# Patient Record
Sex: Male | Born: 1940
Health system: Southern US, Community
[De-identification: ages and names within clinical notes are randomized; demographics above are authoritative.]

## PROBLEM LIST (undated history)

## (undated) DIAGNOSIS — R5383 Other fatigue: Secondary | ICD-10-CM

## (undated) DIAGNOSIS — N200 Calculus of kidney: Secondary | ICD-10-CM

## (undated) DIAGNOSIS — I10 Essential (primary) hypertension: Secondary | ICD-10-CM

## (undated) DIAGNOSIS — E785 Hyperlipidemia, unspecified: Secondary | ICD-10-CM

## (undated) DIAGNOSIS — E669 Obesity, unspecified: Secondary | ICD-10-CM

## (undated) DIAGNOSIS — R079 Chest pain, unspecified: Secondary | ICD-10-CM

## (undated) DIAGNOSIS — C439 Malignant melanoma of skin, unspecified: Secondary | ICD-10-CM

## (undated) DIAGNOSIS — R5381 Other malaise: Secondary | ICD-10-CM

## (undated) HISTORY — PX: EYE SURGERY: SHX253

## (undated) HISTORY — PX: SKIN SURGERY: SHX2413

## (undated) HISTORY — PX: HERNIA REPAIR: SHX51

## (undated) HISTORY — DX: Other malaise: R53.81

## (undated) HISTORY — DX: Calculus of kidney: N20.0

## (undated) HISTORY — DX: Obesity, unspecified: E66.9

## (undated) HISTORY — DX: Other fatigue: R53.83

## (undated) HISTORY — PX: BUNIONECTOMY: SHX129

## (undated) HISTORY — DX: Hyperlipidemia, unspecified: E78.5

## (undated) HISTORY — DX: Essential (primary) hypertension: I10

## (undated) HISTORY — DX: Chest pain, unspecified: R07.9

---

## 1981-11-27 DIAGNOSIS — C439 Malignant melanoma of skin, unspecified: Secondary | ICD-10-CM

## 1981-11-27 HISTORY — DX: Malignant melanoma of skin, unspecified: C43.9

## 2013-05-15 ENCOUNTER — Ambulatory Visit: Payer: Self-pay | Admitting: Family Medicine

## 2013-08-14 ENCOUNTER — Ambulatory Visit: Payer: Self-pay | Admitting: Cardiovascular Disease

## 2013-08-22 ENCOUNTER — Encounter: Payer: Self-pay | Admitting: *Deleted

## 2013-08-25 ENCOUNTER — Encounter: Payer: Self-pay | Admitting: Cardiovascular Disease

## 2013-08-25 ENCOUNTER — Ambulatory Visit (INDEPENDENT_AMBULATORY_CARE_PROVIDER_SITE_OTHER): Payer: Medicare Other | Admitting: Cardiovascular Disease

## 2013-08-25 VITALS — BP 122/82 | HR 64 | Ht 67.0 in | Wt 214.0 lb

## 2013-08-25 VITALS — BP 122/82 | HR 64 | Ht 67.0 in | Wt 214.8 lb

## 2013-08-25 DIAGNOSIS — R079 Chest pain, unspecified: Secondary | ICD-10-CM

## 2013-08-25 DIAGNOSIS — E785 Hyperlipidemia, unspecified: Secondary | ICD-10-CM | POA: Insufficient documentation

## 2013-08-25 DIAGNOSIS — I10 Essential (primary) hypertension: Secondary | ICD-10-CM | POA: Insufficient documentation

## 2013-08-25 NOTE — Procedures (Signed)
    Treadmill Stress test  Indication: Atypical chest pain with exertional dyspnea.  Baseline Data:  Resting EKG shows NSR with rate of 72 bpm, no significant ST or T wave changes. Resting blood pressure of 142/84 mm Hg Stand bruce protocal was used.  Exercise Data:  Patient exercised for 6 min 0 sec,  Peak heart rate of 144 bpm.  This was 98 % of the maximum predicted heart rate. No symptoms of chest pain or lightheadedness were reported at peak stress or in recovery.  Peak Blood pressure recorded was 190/90 Maximal work level: 7 METs.  Heart rate at 3 minutes in recovery was 101 bpm. BP response: Normal HR response: Normal  EKG with Exercise: Sinus tachycardia with 0.5 mm of upsloping ST depression in the inferior leads. Few PVCs noted in recovery.  FINAL IMPRESSION: Normal exercise stress test. No significant EKG changes concerning for ischemia. Good exercise tolerance.  Recommendation: The chest pain seems to be noncardiac. I discussed with him the importance of treating risk factors, healthy diet and regular exercise.

## 2013-08-25 NOTE — Assessment & Plan Note (Signed)
Chest pain is overall atypical and likely musculoskeletal in nature. He reports having a chest x-ray done which was suggestive of a fractured rib. He does have associated exertional dyspnea and due to that I proceeded with a treadmill stress test which showed no evidence of ischemia and no reproducible chest pain with exercise. Based on that, chest pain seems to be noncardiac. I discussed with the patient the importance of lifestyle changes in order to decrease the chance of future coronary artery disease and cardiovascular events. We discussed the importance of controlling risk factors, healthy diet as well as regular exercise. I also explained to him that a normal stress test does not rule out atherosclerosis.

## 2013-08-25 NOTE — Patient Instructions (Addendum)
Your stress test is normal.  Follow up as needed.  

## 2013-08-25 NOTE — Progress Notes (Signed)
Primary care physician: Dr. Marguerite Olea  HPI  This is a pleasant 72 year old man who was referred by Dr. Marguerite Olea for evaluation of atypical chest pain, exertional dyspnea and hypertension. He has no previous cardiac history. He has chronic medical conditions that include hypertension, hyperlipidemia, obesity and family history of coronary artery disease (although, no premature CAD). Both parents died in their 70s from myocardial infarction. He reports prolonged history of hypertension which has been controlled up until he gained 20 pounds over the last year. He also has known history of hyperlipidemia with intolerance to statins and other and hyperlipidemia medications. He reports prolonged history of mild a current chest discomfort over the last 6-12 months. This is usually localized to the right side and left side of the chest. It happens at rest and not with physical activities. It's response to Aleve. He did notice increased exertional dyspnea. He denies palpitations, dizziness or syncope.   No Known Allergies   Current Outpatient Prescriptions on File Prior to Visit  Medication Sig Dispense Refill  . Ascorbic Acid (VITAMIN C) 1000 MG tablet Take 1,000 mg by mouth daily.      Marland Kitchen aspirin 81 MG tablet Take 81 mg by mouth daily.      Marland Kitchen BIOTIN PO Take 500 mg by mouth daily.      . Calcium 500 MG tablet Take 500 mg by mouth daily.      . Calcium Carbonate-Vitamin D (CALCIUM-VITAMIN D) 500-200 MG-UNIT per tablet Take 1 tablet by mouth 2 (two) times daily with a meal.      . Coenzyme Q10-Vitamin E 100-300 MG-UNIT WAFR Take by mouth daily.      Marland Kitchen losartan-hydrochlorothiazide (HYZAAR) 100-12.5 MG per tablet Take 1 tablet by mouth daily.      . Multiple Vitamin (MULTIVITAMIN) tablet Take 1 tablet by mouth daily.       No current facility-administered medications on file prior to visit.     Past Medical History  Diagnosis Date  . Chest pain, unspecified   . Other malaise and fatigue   . Kidney  stone   . Other and unspecified hyperlipidemia   . Hypertension   . Obesity      Past Surgical History  Procedure Laterality Date  . Hernia repair    . Bunionectomy    . Skin surgery      melanoma     Family History  Problem Relation Age of Onset  . Heart attack Mother   . Hypertension Mother   . Heart attack Father   . Hypertension Father      History   Social History  . Marital Status: Married    Spouse Name: N/A    Number of Children: N/A  . Years of Education: N/A   Occupational History  . Not on file.   Social History Main Topics  . Smoking status: Former Smoker -- 1.00 packs/day for 30 years    Types: Cigarettes  . Smokeless tobacco: Not on file  . Alcohol Use: Yes     Comment: occassional  . Drug Use: No  . Sexual Activity: Not on file   Other Topics Concern  . Not on file   Social History Narrative  . No narrative on file     ROS A 10 point review of system was performed. It is negative other than what's mentioned in the history of present illness.  PHYSICAL EXAM   BP 122/82  Pulse 64  Ht 5\' 7"  (1.702 m)  Wt  214 lb 12 oz (97.41 kg)  BMI 33.63 kg/m2 Constitutional: He is oriented to person, place, and time. He appears well-developed and well-nourished. No distress.  HENT: No nasal discharge.  Head: Normocephalic and atraumatic.  Eyes: Pupils are equal and round. Right eye exhibits no discharge. Left eye exhibits no discharge.  Neck: Normal range of motion. Neck supple. No JVD present. No thyromegaly present.  Cardiovascular: Normal rate, regular rhythm, normal heart sounds and. Exam reveals no gallop and no friction rub. No murmur heard.  Pulmonary/Chest: Effort normal and breath sounds normal. No stridor. No respiratory distress. He has no wheezes. He has no rales. He exhibits no tenderness.  Abdominal: Soft. Bowel sounds are normal. He exhibits no distension. There is no tenderness. There is no rebound and no guarding.  Musculoskeletal:  Normal range of motion. He exhibits no edema and no tenderness.  Neurological: He is alert and oriented to person, place, and time. Coordination normal.  Skin: Skin is warm and dry. No rash noted. He is not diaphoretic. No erythema. No pallor.  Psychiatric: He has a normal mood and affect. His behavior is normal. Judgment and thought content normal.       WJX:BJYNW  Rhythm  WITHIN NORMAL LIMITS   ASSESSMENT AND PLAN

## 2013-08-25 NOTE — Assessment & Plan Note (Signed)
Blood pressure is now well controlled after the recent addition of amlodipine. This should improve further with exercise and weight loss.

## 2013-08-25 NOTE — Patient Instructions (Addendum)
Your physician has requested that you have an exercise tolerance test. For further information please visit [email or web address] Please also follow instruction sheet, as given.  Try Red yeast Rice tablets for high cholesterol.   Follow up as needed.

## 2013-08-25 NOTE — Assessment & Plan Note (Signed)
Most recent lipid profile showed a total cholesterol of 295, triglyceride of 218, HDL 52 and an LDL of 199. He reports intolerance to almost all statins as well as Zetia and WelChol.  I asked him to consider using Red yeast rice.

## 2014-04-06 ENCOUNTER — Ambulatory Visit: Payer: Self-pay | Admitting: Gastroenterology

## 2015-12-02 DIAGNOSIS — Z79899 Other long term (current) drug therapy: Secondary | ICD-10-CM | POA: Diagnosis not present

## 2015-12-02 DIAGNOSIS — E7800 Pure hypercholesterolemia, unspecified: Secondary | ICD-10-CM | POA: Diagnosis not present

## 2015-12-09 DIAGNOSIS — I10 Essential (primary) hypertension: Secondary | ICD-10-CM | POA: Diagnosis not present

## 2015-12-09 DIAGNOSIS — E7800 Pure hypercholesterolemia, unspecified: Secondary | ICD-10-CM | POA: Diagnosis not present

## 2015-12-09 DIAGNOSIS — Z79899 Other long term (current) drug therapy: Secondary | ICD-10-CM | POA: Diagnosis not present

## 2015-12-09 DIAGNOSIS — I712 Thoracic aortic aneurysm, without rupture: Secondary | ICD-10-CM | POA: Diagnosis not present

## 2015-12-09 DIAGNOSIS — Z125 Encounter for screening for malignant neoplasm of prostate: Secondary | ICD-10-CM | POA: Diagnosis not present

## 2016-02-04 DIAGNOSIS — L723 Sebaceous cyst: Secondary | ICD-10-CM | POA: Diagnosis not present

## 2016-02-04 DIAGNOSIS — E7800 Pure hypercholesterolemia, unspecified: Secondary | ICD-10-CM | POA: Diagnosis not present

## 2016-05-25 DIAGNOSIS — E7800 Pure hypercholesterolemia, unspecified: Secondary | ICD-10-CM | POA: Diagnosis not present

## 2016-05-25 DIAGNOSIS — Z125 Encounter for screening for malignant neoplasm of prostate: Secondary | ICD-10-CM | POA: Diagnosis not present

## 2016-05-25 DIAGNOSIS — Z79899 Other long term (current) drug therapy: Secondary | ICD-10-CM | POA: Diagnosis not present

## 2016-06-01 DIAGNOSIS — E7800 Pure hypercholesterolemia, unspecified: Secondary | ICD-10-CM | POA: Diagnosis not present

## 2016-06-01 DIAGNOSIS — I10 Essential (primary) hypertension: Secondary | ICD-10-CM | POA: Diagnosis not present

## 2016-06-01 DIAGNOSIS — Z79899 Other long term (current) drug therapy: Secondary | ICD-10-CM | POA: Diagnosis not present

## 2016-06-01 DIAGNOSIS — Z0000 Encounter for general adult medical examination without abnormal findings: Secondary | ICD-10-CM | POA: Diagnosis not present

## 2016-06-08 DIAGNOSIS — H2512 Age-related nuclear cataract, left eye: Secondary | ICD-10-CM | POA: Diagnosis not present

## 2016-08-04 DIAGNOSIS — H2512 Age-related nuclear cataract, left eye: Secondary | ICD-10-CM | POA: Diagnosis not present

## 2016-08-08 ENCOUNTER — Encounter: Payer: Self-pay | Admitting: *Deleted

## 2016-08-15 ENCOUNTER — Ambulatory Visit: Payer: PPO | Admitting: Anesthesiology

## 2016-08-15 ENCOUNTER — Ambulatory Visit
Admission: RE | Admit: 2016-08-15 | Discharge: 2016-08-15 | Disposition: A | Discharge status: Home / Self Care | Payer: PPO | Source: Ambulatory Visit | Attending: Ophthalmology | Admitting: Ophthalmology

## 2016-08-15 ENCOUNTER — Encounter
Admission: RE | Disposition: A | Discharge status: Home / Self Care | Payer: Self-pay | Source: Ambulatory Visit | Attending: Ophthalmology

## 2016-08-15 ENCOUNTER — Encounter: Payer: Self-pay | Admitting: *Deleted

## 2016-08-15 DIAGNOSIS — Z87891 Personal history of nicotine dependence: Secondary | ICD-10-CM | POA: Diagnosis not present

## 2016-08-15 DIAGNOSIS — H2512 Age-related nuclear cataract, left eye: Secondary | ICD-10-CM | POA: Diagnosis not present

## 2016-08-15 DIAGNOSIS — Z87442 Personal history of urinary calculi: Secondary | ICD-10-CM | POA: Diagnosis not present

## 2016-08-15 HISTORY — PX: CATARACT EXTRACTION W/PHACO: SHX586

## 2016-08-15 SURGERY — PHACOEMULSIFICATION, CATARACT, WITH IOL INSERTION
Anesthesia: Monitor Anesthesia Care | Site: Eye | Laterality: Left | Wound class: Clean

## 2016-08-15 MED ORDER — EPINEPHRINE HCL 1 MG/ML IJ SOLN
INTRAOCULAR | Status: DC | PRN
  Administered 2016-08-15: 1 mL via OPHTHALMIC

## 2016-08-15 MED ORDER — MOXIFLOXACIN HCL 0.5 % OP SOLN
OPHTHALMIC | Status: DC | PRN
  Administered 2016-08-15: 1 [drp] via OPHTHALMIC

## 2016-08-15 MED ORDER — MIDAZOLAM HCL 2 MG/2ML IJ SOLN
INTRAMUSCULAR | Status: DC | PRN
  Administered 2016-08-15: 2 mg via INTRAVENOUS

## 2016-08-15 MED ORDER — CARBACHOL 0.01 % IO SOLN
INTRAOCULAR | Status: DC | PRN
  Administered 2016-08-15: .5 mL via INTRAOCULAR

## 2016-08-15 MED ORDER — CEFUROXIME OPHTHALMIC INJECTION 1 MG/0.1 ML
INJECTION | OPHTHALMIC | Status: AC
  Filled 2016-08-15: qty 0.1

## 2016-08-15 MED ORDER — ARMC OPHTHALMIC DILATING GEL
OPHTHALMIC | Status: AC
  Filled 2016-08-15: qty 0.25

## 2016-08-15 MED ORDER — CEFUROXIME OPHTHALMIC INJECTION 1 MG/0.1 ML
INJECTION | OPHTHALMIC | Status: DC | PRN
  Administered 2016-08-15: .1 mL via INTRACAMERAL

## 2016-08-15 MED ORDER — TETRACAINE HCL 0.5 % OP SOLN
OPHTHALMIC | Status: AC
  Filled 2016-08-15: qty 2

## 2016-08-15 MED ORDER — NA CHONDROIT SULF-NA HYALURON 40-17 MG/ML IO SOLN
INTRAOCULAR | Status: DC | PRN
  Administered 2016-08-15: 1 mL via INTRAOCULAR

## 2016-08-15 MED ORDER — FENTANYL CITRATE (PF) 100 MCG/2ML IJ SOLN
INTRAMUSCULAR | Status: DC | PRN
  Administered 2016-08-15: 50 ug via INTRAVENOUS

## 2016-08-15 MED ORDER — POVIDONE-IODINE 5 % OP SOLN
OPHTHALMIC | Status: AC
  Filled 2016-08-15: qty 30

## 2016-08-15 MED ORDER — EPINEPHRINE HCL 1 MG/ML IJ SOLN
INTRAMUSCULAR | Status: AC
  Filled 2016-08-15: qty 1

## 2016-08-15 MED ORDER — MOXIFLOXACIN HCL 0.5 % OP SOLN
OPHTHALMIC | Status: AC
  Filled 2016-08-15: qty 3

## 2016-08-15 MED ORDER — NA CHONDROIT SULF-NA HYALURON 40-17 MG/ML IO SOLN
INTRAOCULAR | Status: AC
  Filled 2016-08-15: qty 1

## 2016-08-15 MED ORDER — MOXIFLOXACIN HCL 0.5 % OP SOLN: 1.0000 [drp] | OPHTHALMIC | Status: DC | PRN

## 2016-08-15 MED ORDER — SODIUM CHLORIDE 0.9 % IV SOLN
INTRAVENOUS | Status: DC
  Administered 2016-08-15 (×2): via INTRAVENOUS

## 2016-08-15 MED ORDER — POVIDONE-IODINE 5 % OP SOLN
1.0000 "application " | Freq: Once | OPHTHALMIC | Status: AC
  Administered 2016-08-15: 1 via OPHTHALMIC

## 2016-08-15 MED ORDER — ARMC OPHTHALMIC DILATING GEL
1.0000 "application " | OPHTHALMIC | Status: AC | PRN
  Administered 2016-08-15 (×2): 1 via OPHTHALMIC

## 2016-08-15 MED ORDER — TETRACAINE HCL 0.5 % OP SOLN
1.0000 [drp] | Freq: Once | OPHTHALMIC | Status: AC
  Administered 2016-08-15: 1 [drp] via OPHTHALMIC

## 2016-08-15 SURGICAL SUPPLY — 21 items
CANNULA ANT/CHMB 27GA (MISCELLANEOUS) ×3 IMPLANT
CUP MEDICINE 2OZ PLAST GRAD ST (MISCELLANEOUS) ×3 IMPLANT
GLOVE BIO SURGEON STRL SZ8 (GLOVE) ×3 IMPLANT
GLOVE BIOGEL M 6.5 STRL (GLOVE) ×3 IMPLANT
GLOVE SURG LX 8.0 MICRO (GLOVE) ×2
GLOVE SURG LX STRL 8.0 MICRO (GLOVE) ×1 IMPLANT
GOWN STRL REUS W/ TWL LRG LVL3 (GOWN DISPOSABLE) ×2 IMPLANT
GOWN STRL REUS W/TWL LRG LVL3 (GOWN DISPOSABLE) ×4
LENS IOL TECNIS ITEC 19.0 (Intraocular Lens) ×3 IMPLANT
PACK CATARACT (MISCELLANEOUS) ×3 IMPLANT
PACK CATARACT BRASINGTON LX (MISCELLANEOUS) ×3 IMPLANT
PACK EYE AFTER SURG (MISCELLANEOUS) ×3 IMPLANT
SOL BSS BAG (MISCELLANEOUS) ×3
SOL PREP PVP 2OZ (MISCELLANEOUS) ×3
SOLUTION BSS BAG (MISCELLANEOUS) ×1 IMPLANT
SOLUTION PREP PVP 2OZ (MISCELLANEOUS) ×1 IMPLANT
SYR 3ML LL SCALE MARK (SYRINGE) ×3 IMPLANT
SYR 5ML LL (SYRINGE) ×3 IMPLANT
SYR TB 1ML 27GX1/2 LL (SYRINGE) ×3 IMPLANT
WATER STERILE IRR 250ML POUR (IV SOLUTION) ×3 IMPLANT
WIPE NON LINTING 3.25X3.25 (MISCELLANEOUS) ×3 IMPLANT

## 2016-08-15 NOTE — H&P (Signed)
All labs reviewed. Abnormal studies sent to patients PCP when indicated.  Previous H&P reviewed, patient examined, there are NO CHANGES.  Jay [name] LOUIS9/19/20179:50 AM

## 2016-08-15 NOTE — Op Note (Signed)
PREOPERATIVE DIAGNOSIS:  Nuclear sclerotic cataract of the left eye.   POSTOPERATIVE DIAGNOSIS:  nuclear sclerotic cataract left eye   OPERATIVE PROCEDURE:  Procedure(s): CATARACT EXTRACTION PHACO AND INTRAOCULAR LENS PLACEMENT (IOC)   SURGEON:  Birder Robson, MD.   ANESTHESIA:   Anesthesiologist: Martha Clan, MD CRNA: Allean Found, CRNA  1.      Managed anesthesia care. 2.      Topical tetracaine drops followed by 2% Xylocaine jelly applied in the preoperative holding area.   COMPLICATIONS:  None.   TECHNIQUE:   Stop and chop   DESCRIPTION OF PROCEDURE:  The patient was examined and consented in the preoperative holding area where the aforementioned topical anesthesia was applied to the left eye and then brought back to the Operating Room where the left eye was prepped and draped in the usual sterile ophthalmic fashion and a lid speculum was placed. A paracentesis was created with the side port blade and the anterior chamber was filled with viscoelastic. A near clear corneal incision was performed with the steel keratome. A continuous curvilinear capsulorrhexis was performed with a cystotome followed by the capsulorrhexis forceps. Hydrodissection and hydrodelineation were carried out with BSS on a blunt cannula. The lens was removed in a stop and chop  technique and the remaining cortical material was removed with the irrigation-aspiration handpiece. The capsular bag was inflated with viscoelastic and the Technis [id number] lens was placed in the capsular bag without complication. The remaining viscoelastic was removed from the eye with the irrigation-aspiration handpiece. The wounds were hydrated. The anterior chamber was flushed with Miostat and the eye was inflated to physiologic pressure. 0.1 mL of cefuroxime concentration 10 mg/mL was placed in the anterior chamber. The wounds were found to be water tight. The eye was dressed with Vigamox. The patient was given protective glasses to wear  throughout the day and a shield with which to sleep tonight. The patient was also given drops with which to begin a drop regimen today and will follow-up with me in one day.  Implant Name Type Inv. Item Serial No. Manufacturer Lot No. LRB No. Used  LENS IOL DIOP 19.0 - [id number] Intraocular Lens LENS IOL DIOP 19.0 [id number] AMO   Left 1   Procedure(s) with comments: CATARACT EXTRACTION PHACO AND INTRAOCULAR LENS PLACEMENT (IOC) (Left) - [place] 00:47 AP% 17.8 CDE 8.46 Fluid pack lot # [id number]  Electronically signed: [place] 08/15/2016 10:18 AM

## 2016-08-15 NOTE — Anesthesia Preprocedure Evaluation (Signed)
Anesthesia Evaluation  Patient identified by MRN, date of birth, ID band Patient awake    Reviewed: Allergy & Precautions, H&P , NPO status , Patient's Chart, lab work & pertinent test results, reviewed documented beta blocker date and time   History of Anesthesia Complications Negative for: history of anesthetic complications  Airway Mallampati: III  TM Distance: >3 FB Neck ROM: full    Dental no notable dental hx. (+) Caps   Pulmonary neg pulmonary ROS, former smoker,    Pulmonary exam normal breath sounds clear to auscultation       Cardiovascular Exercise Tolerance: Good hypertension, (-) angina(-) CAD, (-) Past MI, (-) Cardiac Stents and (-) CABG Normal cardiovascular exam(-) dysrhythmias (-) Valvular Problems/Murmurs Rhythm:regular Rate:Normal     Neuro/Psych negative neurological ROS  negative psych ROS   GI/Hepatic negative GI ROS, Neg liver ROS,   Endo/Other  negative endocrine ROS  Renal/GU Renal disease (kidney stones)  negative genitourinary   Musculoskeletal   Abdominal   Peds  Hematology negative hematology ROS (+)   Anesthesia Other Findings Past Medical History: No date: Chest pain, unspecified No date: Hypertension No date: Kidney stone No date: Obesity No date: Other and unspecified hyperlipidemia No date: Other malaise and fatigue   Reproductive/Obstetrics negative OB ROS                             Anesthesia Physical Anesthesia Plan  ASA: II  Anesthesia Plan: MAC   Post-op Pain Management:    Induction:   Airway Management Planned:   Additional Equipment:   Intra-op Plan:   Post-operative Plan:   Informed Consent: I have reviewed the patients History and Physical, chart, labs and discussed the procedure including the risks, benefits and alternatives for the proposed anesthesia with the patient or authorized representative who has indicated his/her  understanding and acceptance.   Dental Advisory Given  Plan Discussed with: Anesthesiologist, CRNA and Surgeon  Anesthesia Plan Comments:         Anesthesia Quick Evaluation

## 2016-08-15 NOTE — Anesthesia Procedure Notes (Signed)
Procedure Name: MAC Date/Time: 08/15/2016 10:05 AM Performed by: Allean Found Pre-anesthesia Checklist: Patient identified, Emergency Drugs available, Suction available, Patient being monitored and Timeout performed Patient Re-evaluated:Patient Re-evaluated prior to inductionOxygen Delivery Method: Nasal cannula Intubation Type: IV induction Dental Injury: Teeth and Oropharynx as per pre-operative assessment

## 2016-08-15 NOTE — Discharge Instructions (Signed)
Eye Surgery Discharge Instructions  Expect mild scratchy sensation or mild soreness. DO NOT RUB YOUR EYE!  The day of surgery:  Minimal physical activity, but bed rest is not required  No reading, computer work, or close hand work  No bending, lifting, or straining.  May watch TV  For 24 hours:  No driving, legal decisions, or alcoholic beverages  Safety precautions  Eat anything you prefer: It is better to start with liquids, then soup then solid foods.  _____ Eye patch should be worn until postoperative exam tomorrow.  ____ Solar shield eyeglasses should be worn for comfort in the sunlight/patch while sleeping  Resume all regular medications including aspirin or Coumadin if these were discontinued prior to surgery. You may shower, bathe, shave, or wash your hair. Tylenol may be taken for mild discomfort.  Call your doctor if you experience significant pain, nausea, or vomiting, fever > 101 or other signs of infection. [phone] or [phone] Specific instructions:  Follow-up Information    PORFILIO,WILLIAM LOUIS, MD Follow up on 08/16/2016.   Specialty:  Ophthalmology Why:  10:00 Contact information: [address] [place] [address] [phone]

## 2016-08-15 NOTE — Transfer of Care (Signed)
Immediate Anesthesia Transfer of Care Note  Patient: Jay [name]  Procedure(s) Performed: Procedure(s) with comments: CATARACT EXTRACTION PHACO AND INTRAOCULAR LENS PLACEMENT (IOC) (Left) - [place] 00:47 AP% 17.8 CDE 8.46 Fluid pack lot # [id number]  Patient Location: PACU  Anesthesia Type:MAC  Level of Consciousness: awake  Airway & Oxygen Therapy: Patient Spontanous Breathing  Post-op Assessment: Report given to RN and Post -op Vital signs reviewed and stable  Post vital signs: Reviewed and stable  Last Vitals:  Vitals:   08/15/16 0839  BP: 129/76  Pulse: 62  Resp: 16  Temp: 36.6 C    Last Pain:  Vitals:   08/15/16 0839  TempSrc: Tympanic         Complications: No apparent anesthesia complications

## 2016-08-15 NOTE — Anesthesia Postprocedure Evaluation (Signed)
Anesthesia Post Note  Patient: Jay [name]  Procedure(s) Performed: Procedure(s) (LRB): CATARACT EXTRACTION PHACO AND INTRAOCULAR LENS PLACEMENT (IOC) (Left)  Patient location during evaluation: PACU Anesthesia Type: MAC Level of consciousness: awake Pain management: pain level controlled Vital Signs Assessment: post-procedure vital signs reviewed and stable Respiratory status: spontaneous breathing Cardiovascular status: blood pressure returned to baseline Postop Assessment: no headache Anesthetic complications: no    Last Vitals:  Vitals:   08/15/16 0839  BP: 129/76  Pulse: 62  Resp: 16  Temp: 36.6 C    Last Pain:  Vitals:   08/15/16 0839  TempSrc: Tympanic                 [name]

## 2016-11-28 DIAGNOSIS — Z79899 Other long term (current) drug therapy: Secondary | ICD-10-CM | POA: Diagnosis not present

## 2016-11-28 DIAGNOSIS — E7800 Pure hypercholesterolemia, unspecified: Secondary | ICD-10-CM | POA: Diagnosis not present

## 2016-12-04 ENCOUNTER — Other Ambulatory Visit: Payer: Self-pay | Admitting: Family Medicine

## 2016-12-04 DIAGNOSIS — Z79899 Other long term (current) drug therapy: Secondary | ICD-10-CM | POA: Diagnosis not present

## 2016-12-04 DIAGNOSIS — I712 Thoracic aortic aneurysm, without rupture, unspecified: Secondary | ICD-10-CM

## 2016-12-04 DIAGNOSIS — I10 Essential (primary) hypertension: Secondary | ICD-10-CM | POA: Diagnosis not present

## 2016-12-04 DIAGNOSIS — Z125 Encounter for screening for malignant neoplasm of prostate: Secondary | ICD-10-CM | POA: Diagnosis not present

## 2016-12-04 DIAGNOSIS — R739 Hyperglycemia, unspecified: Secondary | ICD-10-CM | POA: Diagnosis not present

## 2016-12-04 DIAGNOSIS — E7800 Pure hypercholesterolemia, unspecified: Secondary | ICD-10-CM | POA: Diagnosis not present

## 2016-12-18 ENCOUNTER — Ambulatory Visit
Admission: RE | Admit: 2016-12-18 | Discharge: 2016-12-18 | Disposition: A | Discharge status: Home / Self Care | Payer: PPO | Source: Ambulatory Visit | Attending: Family Medicine | Admitting: Family Medicine

## 2016-12-18 DIAGNOSIS — I712 Thoracic aortic aneurysm, without rupture, unspecified: Secondary | ICD-10-CM

## 2016-12-18 DIAGNOSIS — R933 Abnormal findings on diagnostic imaging of other parts of digestive tract: Secondary | ICD-10-CM | POA: Diagnosis not present

## 2016-12-18 DIAGNOSIS — I2510 Atherosclerotic heart disease of native coronary artery without angina pectoris: Secondary | ICD-10-CM | POA: Insufficient documentation

## 2016-12-18 DIAGNOSIS — I700 Atherosclerosis of aorta: Secondary | ICD-10-CM | POA: Diagnosis not present

## 2016-12-18 HISTORY — DX: Malignant melanoma of skin, unspecified: C43.9

## 2016-12-18 MED ORDER — IOPAMIDOL (ISOVUE-370) INJECTION 76%
100.0000 mL | Freq: Once | INTRAVENOUS | Status: AC | PRN
  Administered 2016-12-18: 100 mL via INTRAVENOUS

## 2016-12-19 ENCOUNTER — Other Ambulatory Visit: Payer: Self-pay | Admitting: Family Medicine

## 2016-12-19 DIAGNOSIS — K8689 Other specified diseases of pancreas: Secondary | ICD-10-CM

## 2017-01-01 ENCOUNTER — Ambulatory Visit
Admission: RE | Admit: 2017-01-01 | Discharge: 2017-01-01 | Disposition: A | Discharge status: Home / Self Care | Payer: PPO | Source: Ambulatory Visit | Attending: Family Medicine | Admitting: Family Medicine

## 2017-01-01 DIAGNOSIS — K862 Cyst of pancreas: Secondary | ICD-10-CM | POA: Diagnosis not present

## 2017-01-01 DIAGNOSIS — K8689 Other specified diseases of pancreas: Secondary | ICD-10-CM

## 2017-01-01 MED ORDER — GADOBENATE DIMEGLUMINE 529 MG/ML IV SOLN
18.0000 mL | Freq: Once | INTRAVENOUS | Status: AC | PRN
  Administered 2017-01-01: 18 mL via INTRAVENOUS

## 2017-05-17 DIAGNOSIS — Z961 Presence of intraocular lens: Secondary | ICD-10-CM | POA: Diagnosis not present

## 2017-06-07 DIAGNOSIS — E7800 Pure hypercholesterolemia, unspecified: Secondary | ICD-10-CM | POA: Diagnosis not present

## 2017-06-07 DIAGNOSIS — Z79899 Other long term (current) drug therapy: Secondary | ICD-10-CM | POA: Diagnosis not present

## 2017-06-07 DIAGNOSIS — Z125 Encounter for screening for malignant neoplasm of prostate: Secondary | ICD-10-CM | POA: Diagnosis not present

## 2017-06-07 DIAGNOSIS — R739 Hyperglycemia, unspecified: Secondary | ICD-10-CM | POA: Diagnosis not present

## 2017-06-12 ENCOUNTER — Other Ambulatory Visit: Payer: Self-pay | Admitting: Family Medicine

## 2017-06-12 DIAGNOSIS — Z0000 Encounter for general adult medical examination without abnormal findings: Secondary | ICD-10-CM | POA: Diagnosis not present

## 2017-06-12 DIAGNOSIS — R739 Hyperglycemia, unspecified: Secondary | ICD-10-CM | POA: Diagnosis not present

## 2017-06-12 DIAGNOSIS — Z23 Encounter for immunization: Secondary | ICD-10-CM | POA: Diagnosis not present

## 2017-06-12 DIAGNOSIS — K8689 Other specified diseases of pancreas: Secondary | ICD-10-CM

## 2017-07-03 ENCOUNTER — Other Ambulatory Visit: Payer: PPO

## 2017-09-27 DIAGNOSIS — D485 Neoplasm of uncertain behavior of skin: Secondary | ICD-10-CM | POA: Diagnosis not present

## 2017-09-27 DIAGNOSIS — Z85820 Personal history of malignant melanoma of skin: Secondary | ICD-10-CM | POA: Diagnosis not present

## 2017-09-27 DIAGNOSIS — L578 Other skin changes due to chronic exposure to nonionizing radiation: Secondary | ICD-10-CM | POA: Diagnosis not present

## 2017-09-27 DIAGNOSIS — L739 Follicular disorder, unspecified: Secondary | ICD-10-CM | POA: Diagnosis not present

## 2018-02-14 DIAGNOSIS — F418 Other specified anxiety disorders: Secondary | ICD-10-CM | POA: Diagnosis not present

## 2018-02-14 DIAGNOSIS — Z79899 Other long term (current) drug therapy: Secondary | ICD-10-CM | POA: Diagnosis not present

## 2018-02-14 DIAGNOSIS — R739 Hyperglycemia, unspecified: Secondary | ICD-10-CM | POA: Diagnosis not present

## 2018-02-14 DIAGNOSIS — E7800 Pure hypercholesterolemia, unspecified: Secondary | ICD-10-CM | POA: Diagnosis not present

## 2018-03-05 DIAGNOSIS — J209 Acute bronchitis, unspecified: Secondary | ICD-10-CM | POA: Diagnosis not present

## 2018-08-14 DIAGNOSIS — L219 Seborrheic dermatitis, unspecified: Secondary | ICD-10-CM | POA: Diagnosis not present

## 2018-08-14 DIAGNOSIS — L739 Follicular disorder, unspecified: Secondary | ICD-10-CM | POA: Diagnosis not present

## 2018-08-14 DIAGNOSIS — I788 Other diseases of capillaries: Secondary | ICD-10-CM | POA: Diagnosis not present

## 2018-08-14 DIAGNOSIS — Z85820 Personal history of malignant melanoma of skin: Secondary | ICD-10-CM | POA: Diagnosis not present

## 2018-08-14 DIAGNOSIS — L821 Other seborrheic keratosis: Secondary | ICD-10-CM | POA: Diagnosis not present

## 2018-08-14 DIAGNOSIS — L578 Other skin changes due to chronic exposure to nonionizing radiation: Secondary | ICD-10-CM | POA: Diagnosis not present

## 2018-08-14 DIAGNOSIS — L719 Rosacea, unspecified: Secondary | ICD-10-CM | POA: Diagnosis not present

## 2018-09-27 DIAGNOSIS — Z125 Encounter for screening for malignant neoplasm of prostate: Secondary | ICD-10-CM | POA: Diagnosis not present

## 2018-09-27 DIAGNOSIS — R739 Hyperglycemia, unspecified: Secondary | ICD-10-CM | POA: Diagnosis not present

## 2018-09-27 DIAGNOSIS — Z79899 Other long term (current) drug therapy: Secondary | ICD-10-CM | POA: Diagnosis not present

## 2018-09-27 DIAGNOSIS — E7800 Pure hypercholesterolemia, unspecified: Secondary | ICD-10-CM | POA: Diagnosis not present

## 2018-10-04 DIAGNOSIS — Z0000 Encounter for general adult medical examination without abnormal findings: Secondary | ICD-10-CM | POA: Diagnosis not present

## 2019-04-03 DIAGNOSIS — E7800 Pure hypercholesterolemia, unspecified: Secondary | ICD-10-CM | POA: Diagnosis not present

## 2019-04-03 DIAGNOSIS — Z79899 Other long term (current) drug therapy: Secondary | ICD-10-CM | POA: Diagnosis not present

## 2019-04-03 DIAGNOSIS — R739 Hyperglycemia, unspecified: Secondary | ICD-10-CM | POA: Diagnosis not present

## 2019-04-10 DIAGNOSIS — N401 Enlarged prostate with lower urinary tract symptoms: Secondary | ICD-10-CM | POA: Diagnosis not present

## 2019-04-10 DIAGNOSIS — I10 Essential (primary) hypertension: Secondary | ICD-10-CM | POA: Diagnosis not present

## 2019-04-10 DIAGNOSIS — E7800 Pure hypercholesterolemia, unspecified: Secondary | ICD-10-CM | POA: Diagnosis not present

## 2019-04-10 DIAGNOSIS — Z79899 Other long term (current) drug therapy: Secondary | ICD-10-CM | POA: Diagnosis not present

## 2019-04-10 DIAGNOSIS — Z125 Encounter for screening for malignant neoplasm of prostate: Secondary | ICD-10-CM | POA: Diagnosis not present

## 2019-04-10 DIAGNOSIS — R739 Hyperglycemia, unspecified: Secondary | ICD-10-CM | POA: Diagnosis not present

## 2019-04-10 DIAGNOSIS — N183 Chronic kidney disease, stage 3 (moderate): Secondary | ICD-10-CM | POA: Diagnosis not present

## 2019-04-10 DIAGNOSIS — R351 Nocturia: Secondary | ICD-10-CM | POA: Diagnosis not present

## 2019-06-19 DIAGNOSIS — D2271 Melanocytic nevi of right lower limb, including hip: Secondary | ICD-10-CM | POA: Diagnosis not present

## 2019-06-19 DIAGNOSIS — L739 Follicular disorder, unspecified: Secondary | ICD-10-CM | POA: Diagnosis not present

## 2019-06-19 DIAGNOSIS — L821 Other seborrheic keratosis: Secondary | ICD-10-CM | POA: Diagnosis not present

## 2019-06-19 DIAGNOSIS — D2261 Melanocytic nevi of right upper limb, including shoulder: Secondary | ICD-10-CM | POA: Diagnosis not present

## 2019-06-19 DIAGNOSIS — D225 Melanocytic nevi of trunk: Secondary | ICD-10-CM | POA: Diagnosis not present

## 2019-06-19 DIAGNOSIS — L905 Scar conditions and fibrosis of skin: Secondary | ICD-10-CM | POA: Diagnosis not present

## 2019-06-19 DIAGNOSIS — D2262 Melanocytic nevi of left upper limb, including shoulder: Secondary | ICD-10-CM | POA: Diagnosis not present

## 2019-06-19 DIAGNOSIS — B001 Herpesviral vesicular dermatitis: Secondary | ICD-10-CM | POA: Diagnosis not present

## 2019-06-19 DIAGNOSIS — D485 Neoplasm of uncertain behavior of skin: Secondary | ICD-10-CM | POA: Diagnosis not present

## 2019-06-19 DIAGNOSIS — D2272 Melanocytic nevi of left lower limb, including hip: Secondary | ICD-10-CM | POA: Diagnosis not present

## 2019-07-07 DIAGNOSIS — L570 Actinic keratosis: Secondary | ICD-10-CM | POA: Diagnosis not present

## 2019-07-07 DIAGNOSIS — D485 Neoplasm of uncertain behavior of skin: Secondary | ICD-10-CM | POA: Diagnosis not present

## 2019-07-07 DIAGNOSIS — L0202 Furuncle of face: Secondary | ICD-10-CM | POA: Diagnosis not present

## 2019-09-15 DIAGNOSIS — M79645 Pain in left finger(s): Secondary | ICD-10-CM | POA: Diagnosis not present

## 2019-10-10 DIAGNOSIS — E7800 Pure hypercholesterolemia, unspecified: Secondary | ICD-10-CM | POA: Diagnosis not present

## 2019-10-10 DIAGNOSIS — R739 Hyperglycemia, unspecified: Secondary | ICD-10-CM | POA: Diagnosis not present

## 2019-10-10 DIAGNOSIS — N183 Chronic kidney disease, stage 3 unspecified: Secondary | ICD-10-CM | POA: Diagnosis not present

## 2019-10-10 DIAGNOSIS — Z79899 Other long term (current) drug therapy: Secondary | ICD-10-CM | POA: Diagnosis not present

## 2019-10-10 DIAGNOSIS — Z125 Encounter for screening for malignant neoplasm of prostate: Secondary | ICD-10-CM | POA: Diagnosis not present

## 2019-10-17 DIAGNOSIS — Z0000 Encounter for general adult medical examination without abnormal findings: Secondary | ICD-10-CM | POA: Diagnosis not present

## 2019-10-17 DIAGNOSIS — R739 Hyperglycemia, unspecified: Secondary | ICD-10-CM | POA: Diagnosis not present

## 2019-10-21 DIAGNOSIS — M79645 Pain in left finger(s): Secondary | ICD-10-CM | POA: Diagnosis not present

## 2020-01-06 DIAGNOSIS — M79645 Pain in left finger(s): Secondary | ICD-10-CM | POA: Diagnosis not present

## 2020-01-06 DIAGNOSIS — M25531 Pain in right wrist: Secondary | ICD-10-CM | POA: Diagnosis not present

## 2020-01-06 DIAGNOSIS — M25532 Pain in left wrist: Secondary | ICD-10-CM | POA: Diagnosis not present

## 2020-02-04 DIAGNOSIS — M9902 Segmental and somatic dysfunction of thoracic region: Secondary | ICD-10-CM | POA: Diagnosis not present

## 2020-02-04 DIAGNOSIS — M5033 Other cervical disc degeneration, cervicothoracic region: Secondary | ICD-10-CM | POA: Diagnosis not present

## 2020-02-04 DIAGNOSIS — M9901 Segmental and somatic dysfunction of cervical region: Secondary | ICD-10-CM | POA: Diagnosis not present

## 2020-02-04 DIAGNOSIS — M5412 Radiculopathy, cervical region: Secondary | ICD-10-CM | POA: Diagnosis not present

## 2020-02-05 DIAGNOSIS — M9902 Segmental and somatic dysfunction of thoracic region: Secondary | ICD-10-CM | POA: Diagnosis not present

## 2020-02-05 DIAGNOSIS — M5412 Radiculopathy, cervical region: Secondary | ICD-10-CM | POA: Diagnosis not present

## 2020-02-05 DIAGNOSIS — M5033 Other cervical disc degeneration, cervicothoracic region: Secondary | ICD-10-CM | POA: Diagnosis not present

## 2020-02-05 DIAGNOSIS — M9901 Segmental and somatic dysfunction of cervical region: Secondary | ICD-10-CM | POA: Diagnosis not present

## 2020-02-06 DIAGNOSIS — M9901 Segmental and somatic dysfunction of cervical region: Secondary | ICD-10-CM | POA: Diagnosis not present

## 2020-02-06 DIAGNOSIS — M5033 Other cervical disc degeneration, cervicothoracic region: Secondary | ICD-10-CM | POA: Diagnosis not present

## 2020-02-06 DIAGNOSIS — M5412 Radiculopathy, cervical region: Secondary | ICD-10-CM | POA: Diagnosis not present

## 2020-02-06 DIAGNOSIS — M9902 Segmental and somatic dysfunction of thoracic region: Secondary | ICD-10-CM | POA: Diagnosis not present

## 2020-02-09 DIAGNOSIS — M9901 Segmental and somatic dysfunction of cervical region: Secondary | ICD-10-CM | POA: Diagnosis not present

## 2020-02-09 DIAGNOSIS — M5033 Other cervical disc degeneration, cervicothoracic region: Secondary | ICD-10-CM | POA: Diagnosis not present

## 2020-02-09 DIAGNOSIS — M9902 Segmental and somatic dysfunction of thoracic region: Secondary | ICD-10-CM | POA: Diagnosis not present

## 2020-02-09 DIAGNOSIS — M5412 Radiculopathy, cervical region: Secondary | ICD-10-CM | POA: Diagnosis not present

## 2020-02-11 DIAGNOSIS — M9902 Segmental and somatic dysfunction of thoracic region: Secondary | ICD-10-CM | POA: Diagnosis not present

## 2020-02-11 DIAGNOSIS — M9901 Segmental and somatic dysfunction of cervical region: Secondary | ICD-10-CM | POA: Diagnosis not present

## 2020-02-11 DIAGNOSIS — M5412 Radiculopathy, cervical region: Secondary | ICD-10-CM | POA: Diagnosis not present

## 2020-02-11 DIAGNOSIS — M5033 Other cervical disc degeneration, cervicothoracic region: Secondary | ICD-10-CM | POA: Diagnosis not present

## 2020-02-12 DIAGNOSIS — M9901 Segmental and somatic dysfunction of cervical region: Secondary | ICD-10-CM | POA: Diagnosis not present

## 2020-02-12 DIAGNOSIS — M5033 Other cervical disc degeneration, cervicothoracic region: Secondary | ICD-10-CM | POA: Diagnosis not present

## 2020-02-12 DIAGNOSIS — M5412 Radiculopathy, cervical region: Secondary | ICD-10-CM | POA: Diagnosis not present

## 2020-02-12 DIAGNOSIS — M9902 Segmental and somatic dysfunction of thoracic region: Secondary | ICD-10-CM | POA: Diagnosis not present

## 2020-02-16 DIAGNOSIS — M5412 Radiculopathy, cervical region: Secondary | ICD-10-CM | POA: Diagnosis not present

## 2020-02-16 DIAGNOSIS — M5033 Other cervical disc degeneration, cervicothoracic region: Secondary | ICD-10-CM | POA: Diagnosis not present

## 2020-02-16 DIAGNOSIS — M9901 Segmental and somatic dysfunction of cervical region: Secondary | ICD-10-CM | POA: Diagnosis not present

## 2020-02-16 DIAGNOSIS — M9902 Segmental and somatic dysfunction of thoracic region: Secondary | ICD-10-CM | POA: Diagnosis not present

## 2020-02-18 DIAGNOSIS — M9901 Segmental and somatic dysfunction of cervical region: Secondary | ICD-10-CM | POA: Diagnosis not present

## 2020-02-18 DIAGNOSIS — M9902 Segmental and somatic dysfunction of thoracic region: Secondary | ICD-10-CM | POA: Diagnosis not present

## 2020-02-18 DIAGNOSIS — M5412 Radiculopathy, cervical region: Secondary | ICD-10-CM | POA: Diagnosis not present

## 2020-02-18 DIAGNOSIS — M5033 Other cervical disc degeneration, cervicothoracic region: Secondary | ICD-10-CM | POA: Diagnosis not present

## 2020-02-19 DIAGNOSIS — M9902 Segmental and somatic dysfunction of thoracic region: Secondary | ICD-10-CM | POA: Diagnosis not present

## 2020-02-19 DIAGNOSIS — M9901 Segmental and somatic dysfunction of cervical region: Secondary | ICD-10-CM | POA: Diagnosis not present

## 2020-02-19 DIAGNOSIS — M5412 Radiculopathy, cervical region: Secondary | ICD-10-CM | POA: Diagnosis not present

## 2020-02-19 DIAGNOSIS — M5033 Other cervical disc degeneration, cervicothoracic region: Secondary | ICD-10-CM | POA: Diagnosis not present

## 2020-02-24 DIAGNOSIS — M5033 Other cervical disc degeneration, cervicothoracic region: Secondary | ICD-10-CM | POA: Diagnosis not present

## 2020-02-24 DIAGNOSIS — M9902 Segmental and somatic dysfunction of thoracic region: Secondary | ICD-10-CM | POA: Diagnosis not present

## 2020-02-24 DIAGNOSIS — M9901 Segmental and somatic dysfunction of cervical region: Secondary | ICD-10-CM | POA: Diagnosis not present

## 2020-02-24 DIAGNOSIS — M5412 Radiculopathy, cervical region: Secondary | ICD-10-CM | POA: Diagnosis not present

## 2020-03-01 DIAGNOSIS — M5412 Radiculopathy, cervical region: Secondary | ICD-10-CM | POA: Diagnosis not present

## 2020-03-01 DIAGNOSIS — M9902 Segmental and somatic dysfunction of thoracic region: Secondary | ICD-10-CM | POA: Diagnosis not present

## 2020-03-01 DIAGNOSIS — M9901 Segmental and somatic dysfunction of cervical region: Secondary | ICD-10-CM | POA: Diagnosis not present

## 2020-03-01 DIAGNOSIS — M5033 Other cervical disc degeneration, cervicothoracic region: Secondary | ICD-10-CM | POA: Diagnosis not present

## 2020-03-03 DIAGNOSIS — M9901 Segmental and somatic dysfunction of cervical region: Secondary | ICD-10-CM | POA: Diagnosis not present

## 2020-03-03 DIAGNOSIS — M5412 Radiculopathy, cervical region: Secondary | ICD-10-CM | POA: Diagnosis not present

## 2020-03-03 DIAGNOSIS — M9902 Segmental and somatic dysfunction of thoracic region: Secondary | ICD-10-CM | POA: Diagnosis not present

## 2020-03-03 DIAGNOSIS — M5033 Other cervical disc degeneration, cervicothoracic region: Secondary | ICD-10-CM | POA: Diagnosis not present

## 2020-03-08 DIAGNOSIS — M9901 Segmental and somatic dysfunction of cervical region: Secondary | ICD-10-CM | POA: Diagnosis not present

## 2020-03-08 DIAGNOSIS — M5412 Radiculopathy, cervical region: Secondary | ICD-10-CM | POA: Diagnosis not present

## 2020-03-08 DIAGNOSIS — M9902 Segmental and somatic dysfunction of thoracic region: Secondary | ICD-10-CM | POA: Diagnosis not present

## 2020-03-08 DIAGNOSIS — M5033 Other cervical disc degeneration, cervicothoracic region: Secondary | ICD-10-CM | POA: Diagnosis not present

## 2020-03-11 DIAGNOSIS — M9902 Segmental and somatic dysfunction of thoracic region: Secondary | ICD-10-CM | POA: Diagnosis not present

## 2020-03-11 DIAGNOSIS — M5033 Other cervical disc degeneration, cervicothoracic region: Secondary | ICD-10-CM | POA: Diagnosis not present

## 2020-03-11 DIAGNOSIS — M9901 Segmental and somatic dysfunction of cervical region: Secondary | ICD-10-CM | POA: Diagnosis not present

## 2020-03-11 DIAGNOSIS — M5412 Radiculopathy, cervical region: Secondary | ICD-10-CM | POA: Diagnosis not present

## 2020-03-22 DIAGNOSIS — M9902 Segmental and somatic dysfunction of thoracic region: Secondary | ICD-10-CM | POA: Diagnosis not present

## 2020-03-22 DIAGNOSIS — M5412 Radiculopathy, cervical region: Secondary | ICD-10-CM | POA: Diagnosis not present

## 2020-03-22 DIAGNOSIS — M9901 Segmental and somatic dysfunction of cervical region: Secondary | ICD-10-CM | POA: Diagnosis not present

## 2020-03-22 DIAGNOSIS — M5033 Other cervical disc degeneration, cervicothoracic region: Secondary | ICD-10-CM | POA: Diagnosis not present

## 2020-03-25 DIAGNOSIS — M9902 Segmental and somatic dysfunction of thoracic region: Secondary | ICD-10-CM | POA: Diagnosis not present

## 2020-03-25 DIAGNOSIS — M9901 Segmental and somatic dysfunction of cervical region: Secondary | ICD-10-CM | POA: Diagnosis not present

## 2020-03-25 DIAGNOSIS — M5033 Other cervical disc degeneration, cervicothoracic region: Secondary | ICD-10-CM | POA: Diagnosis not present

## 2020-03-25 DIAGNOSIS — M5412 Radiculopathy, cervical region: Secondary | ICD-10-CM | POA: Diagnosis not present

## 2020-03-31 DIAGNOSIS — M5412 Radiculopathy, cervical region: Secondary | ICD-10-CM | POA: Diagnosis not present

## 2020-03-31 DIAGNOSIS — M5033 Other cervical disc degeneration, cervicothoracic region: Secondary | ICD-10-CM | POA: Diagnosis not present

## 2020-03-31 DIAGNOSIS — M9901 Segmental and somatic dysfunction of cervical region: Secondary | ICD-10-CM | POA: Diagnosis not present

## 2020-03-31 DIAGNOSIS — M9902 Segmental and somatic dysfunction of thoracic region: Secondary | ICD-10-CM | POA: Diagnosis not present

## 2020-04-06 DIAGNOSIS — I10 Essential (primary) hypertension: Secondary | ICD-10-CM | POA: Diagnosis not present

## 2020-04-06 DIAGNOSIS — N1831 Chronic kidney disease, stage 3a: Secondary | ICD-10-CM | POA: Diagnosis not present

## 2020-04-06 DIAGNOSIS — R739 Hyperglycemia, unspecified: Secondary | ICD-10-CM | POA: Diagnosis not present

## 2020-04-06 DIAGNOSIS — Z79899 Other long term (current) drug therapy: Secondary | ICD-10-CM | POA: Diagnosis not present

## 2020-04-07 DIAGNOSIS — M5033 Other cervical disc degeneration, cervicothoracic region: Secondary | ICD-10-CM | POA: Diagnosis not present

## 2020-04-07 DIAGNOSIS — M5412 Radiculopathy, cervical region: Secondary | ICD-10-CM | POA: Diagnosis not present

## 2020-04-07 DIAGNOSIS — M9901 Segmental and somatic dysfunction of cervical region: Secondary | ICD-10-CM | POA: Diagnosis not present

## 2020-04-07 DIAGNOSIS — M9902 Segmental and somatic dysfunction of thoracic region: Secondary | ICD-10-CM | POA: Diagnosis not present

## 2020-04-14 DIAGNOSIS — M5033 Other cervical disc degeneration, cervicothoracic region: Secondary | ICD-10-CM | POA: Diagnosis not present

## 2020-04-14 DIAGNOSIS — M9901 Segmental and somatic dysfunction of cervical region: Secondary | ICD-10-CM | POA: Diagnosis not present

## 2020-04-14 DIAGNOSIS — M9902 Segmental and somatic dysfunction of thoracic region: Secondary | ICD-10-CM | POA: Diagnosis not present

## 2020-04-14 DIAGNOSIS — M5412 Radiculopathy, cervical region: Secondary | ICD-10-CM | POA: Diagnosis not present

## 2020-04-15 DIAGNOSIS — I10 Essential (primary) hypertension: Secondary | ICD-10-CM | POA: Diagnosis not present

## 2020-04-15 DIAGNOSIS — R197 Diarrhea, unspecified: Secondary | ICD-10-CM | POA: Diagnosis not present

## 2020-04-15 DIAGNOSIS — E7800 Pure hypercholesterolemia, unspecified: Secondary | ICD-10-CM | POA: Diagnosis not present

## 2020-04-15 DIAGNOSIS — Z79899 Other long term (current) drug therapy: Secondary | ICD-10-CM | POA: Diagnosis not present

## 2020-04-15 DIAGNOSIS — N1831 Chronic kidney disease, stage 3a: Secondary | ICD-10-CM | POA: Diagnosis not present

## 2020-04-15 DIAGNOSIS — Z125 Encounter for screening for malignant neoplasm of prostate: Secondary | ICD-10-CM | POA: Diagnosis not present

## 2020-04-15 DIAGNOSIS — R739 Hyperglycemia, unspecified: Secondary | ICD-10-CM | POA: Diagnosis not present

## 2020-04-15 DIAGNOSIS — D649 Anemia, unspecified: Secondary | ICD-10-CM | POA: Diagnosis not present

## 2020-04-20 DIAGNOSIS — M9902 Segmental and somatic dysfunction of thoracic region: Secondary | ICD-10-CM | POA: Diagnosis not present

## 2020-04-20 DIAGNOSIS — M9901 Segmental and somatic dysfunction of cervical region: Secondary | ICD-10-CM | POA: Diagnosis not present

## 2020-04-20 DIAGNOSIS — M5033 Other cervical disc degeneration, cervicothoracic region: Secondary | ICD-10-CM | POA: Diagnosis not present

## 2020-04-20 DIAGNOSIS — M5412 Radiculopathy, cervical region: Secondary | ICD-10-CM | POA: Diagnosis not present

## 2020-05-04 DIAGNOSIS — M5033 Other cervical disc degeneration, cervicothoracic region: Secondary | ICD-10-CM | POA: Diagnosis not present

## 2020-05-04 DIAGNOSIS — M9901 Segmental and somatic dysfunction of cervical region: Secondary | ICD-10-CM | POA: Diagnosis not present

## 2020-05-04 DIAGNOSIS — M9902 Segmental and somatic dysfunction of thoracic region: Secondary | ICD-10-CM | POA: Diagnosis not present

## 2020-05-04 DIAGNOSIS — M5412 Radiculopathy, cervical region: Secondary | ICD-10-CM | POA: Diagnosis not present

## 2020-05-18 DIAGNOSIS — M5412 Radiculopathy, cervical region: Secondary | ICD-10-CM | POA: Diagnosis not present

## 2020-05-18 DIAGNOSIS — M5033 Other cervical disc degeneration, cervicothoracic region: Secondary | ICD-10-CM | POA: Diagnosis not present

## 2020-05-18 DIAGNOSIS — M9901 Segmental and somatic dysfunction of cervical region: Secondary | ICD-10-CM | POA: Diagnosis not present

## 2020-05-18 DIAGNOSIS — M9902 Segmental and somatic dysfunction of thoracic region: Secondary | ICD-10-CM | POA: Diagnosis not present

## 2020-06-01 DIAGNOSIS — M5033 Other cervical disc degeneration, cervicothoracic region: Secondary | ICD-10-CM | POA: Diagnosis not present

## 2020-06-01 DIAGNOSIS — M9902 Segmental and somatic dysfunction of thoracic region: Secondary | ICD-10-CM | POA: Diagnosis not present

## 2020-06-01 DIAGNOSIS — M5412 Radiculopathy, cervical region: Secondary | ICD-10-CM | POA: Diagnosis not present

## 2020-06-01 DIAGNOSIS — M9901 Segmental and somatic dysfunction of cervical region: Secondary | ICD-10-CM | POA: Diagnosis not present

## 2020-06-15 DIAGNOSIS — M9901 Segmental and somatic dysfunction of cervical region: Secondary | ICD-10-CM | POA: Diagnosis not present

## 2020-06-15 DIAGNOSIS — M9902 Segmental and somatic dysfunction of thoracic region: Secondary | ICD-10-CM | POA: Diagnosis not present

## 2020-06-15 DIAGNOSIS — M5412 Radiculopathy, cervical region: Secondary | ICD-10-CM | POA: Diagnosis not present

## 2020-06-15 DIAGNOSIS — M5033 Other cervical disc degeneration, cervicothoracic region: Secondary | ICD-10-CM | POA: Diagnosis not present

## 2020-07-26 DIAGNOSIS — M25532 Pain in left wrist: Secondary | ICD-10-CM | POA: Diagnosis not present

## 2020-07-26 DIAGNOSIS — M79645 Pain in left finger(s): Secondary | ICD-10-CM | POA: Diagnosis not present

## 2020-07-26 DIAGNOSIS — M25531 Pain in right wrist: Secondary | ICD-10-CM | POA: Diagnosis not present

## 2020-10-11 DIAGNOSIS — Z79899 Other long term (current) drug therapy: Secondary | ICD-10-CM | POA: Diagnosis not present

## 2020-10-11 DIAGNOSIS — Z125 Encounter for screening for malignant neoplasm of prostate: Secondary | ICD-10-CM | POA: Diagnosis not present

## 2020-10-11 DIAGNOSIS — E7800 Pure hypercholesterolemia, unspecified: Secondary | ICD-10-CM | POA: Diagnosis not present

## 2020-10-11 DIAGNOSIS — R739 Hyperglycemia, unspecified: Secondary | ICD-10-CM | POA: Diagnosis not present

## 2020-10-18 DIAGNOSIS — R739 Hyperglycemia, unspecified: Secondary | ICD-10-CM | POA: Diagnosis not present

## 2020-10-18 DIAGNOSIS — K580 Irritable bowel syndrome with diarrhea: Secondary | ICD-10-CM | POA: Diagnosis not present

## 2020-10-18 DIAGNOSIS — Z0000 Encounter for general adult medical examination without abnormal findings: Secondary | ICD-10-CM | POA: Diagnosis not present

## 2020-10-18 DIAGNOSIS — N401 Enlarged prostate with lower urinary tract symptoms: Secondary | ICD-10-CM | POA: Diagnosis not present

## 2020-10-18 DIAGNOSIS — I10 Essential (primary) hypertension: Secondary | ICD-10-CM | POA: Diagnosis not present

## 2020-10-18 DIAGNOSIS — Z1331 Encounter for screening for depression: Secondary | ICD-10-CM | POA: Diagnosis not present

## 2020-10-18 DIAGNOSIS — N1831 Chronic kidney disease, stage 3a: Secondary | ICD-10-CM | POA: Diagnosis not present

## 2020-10-18 DIAGNOSIS — D649 Anemia, unspecified: Secondary | ICD-10-CM | POA: Diagnosis not present

## 2020-10-18 DIAGNOSIS — R351 Nocturia: Secondary | ICD-10-CM | POA: Diagnosis not present

## 2020-11-01 DIAGNOSIS — M25532 Pain in left wrist: Secondary | ICD-10-CM | POA: Diagnosis not present

## 2020-11-01 DIAGNOSIS — M79645 Pain in left finger(s): Secondary | ICD-10-CM | POA: Diagnosis not present

## 2020-11-01 DIAGNOSIS — M25531 Pain in right wrist: Secondary | ICD-10-CM | POA: Diagnosis not present

## 2020-11-30 DIAGNOSIS — M25531 Pain in right wrist: Secondary | ICD-10-CM | POA: Diagnosis not present

## 2020-11-30 DIAGNOSIS — M79645 Pain in left finger(s): Secondary | ICD-10-CM | POA: Diagnosis not present

## 2020-11-30 DIAGNOSIS — M25532 Pain in left wrist: Secondary | ICD-10-CM | POA: Diagnosis not present

## 2020-12-01 DIAGNOSIS — G5602 Carpal tunnel syndrome, left upper limb: Secondary | ICD-10-CM | POA: Diagnosis not present

## 2020-12-06 DIAGNOSIS — M19032 Primary osteoarthritis, left wrist: Secondary | ICD-10-CM | POA: Diagnosis not present

## 2020-12-06 DIAGNOSIS — M25542 Pain in joints of left hand: Secondary | ICD-10-CM | POA: Diagnosis not present

## 2020-12-06 DIAGNOSIS — M1812 Unilateral primary osteoarthritis of first carpometacarpal joint, left hand: Secondary | ICD-10-CM | POA: Diagnosis not present

## 2020-12-06 DIAGNOSIS — M79645 Pain in left finger(s): Secondary | ICD-10-CM | POA: Diagnosis not present

## 2021-01-31 DIAGNOSIS — M25532 Pain in left wrist: Secondary | ICD-10-CM | POA: Diagnosis not present

## 2021-01-31 DIAGNOSIS — M79645 Pain in left finger(s): Secondary | ICD-10-CM | POA: Diagnosis not present

## 2021-01-31 DIAGNOSIS — M25531 Pain in right wrist: Secondary | ICD-10-CM | POA: Diagnosis not present

## 2021-03-01 DIAGNOSIS — M79645 Pain in left finger(s): Secondary | ICD-10-CM | POA: Diagnosis not present

## 2021-03-21 DIAGNOSIS — M25531 Pain in right wrist: Secondary | ICD-10-CM | POA: Diagnosis not present

## 2021-03-21 DIAGNOSIS — M79645 Pain in left finger(s): Secondary | ICD-10-CM | POA: Diagnosis not present

## 2021-03-21 DIAGNOSIS — M25532 Pain in left wrist: Secondary | ICD-10-CM | POA: Diagnosis not present

## 2021-04-28 DIAGNOSIS — I712 Thoracic aortic aneurysm, without rupture: Secondary | ICD-10-CM | POA: Diagnosis not present

## 2021-04-28 DIAGNOSIS — R739 Hyperglycemia, unspecified: Secondary | ICD-10-CM | POA: Diagnosis not present

## 2021-04-28 DIAGNOSIS — M791 Myalgia, unspecified site: Secondary | ICD-10-CM | POA: Diagnosis not present

## 2021-04-28 DIAGNOSIS — N1831 Chronic kidney disease, stage 3a: Secondary | ICD-10-CM | POA: Diagnosis not present

## 2021-04-28 DIAGNOSIS — D649 Anemia, unspecified: Secondary | ICD-10-CM | POA: Diagnosis not present

## 2021-04-28 DIAGNOSIS — E7800 Pure hypercholesterolemia, unspecified: Secondary | ICD-10-CM | POA: Diagnosis not present

## 2021-04-28 DIAGNOSIS — Z125 Encounter for screening for malignant neoplasm of prostate: Secondary | ICD-10-CM | POA: Diagnosis not present

## 2021-04-28 DIAGNOSIS — T466X5A Adverse effect of antihyperlipidemic and antiarteriosclerotic drugs, initial encounter: Secondary | ICD-10-CM | POA: Diagnosis not present

## 2021-04-28 DIAGNOSIS — Z79899 Other long term (current) drug therapy: Secondary | ICD-10-CM | POA: Diagnosis not present

## 2021-04-28 DIAGNOSIS — I10 Essential (primary) hypertension: Secondary | ICD-10-CM | POA: Diagnosis not present

## 2021-09-01 ENCOUNTER — Other Ambulatory Visit: Payer: Self-pay

## 2021-09-01 ENCOUNTER — Emergency Department
Admission: EM | Admit: 2021-09-01 | Discharge: 2021-09-01 | Disposition: A | Discharge status: Home / Self Care | Payer: PPO | Attending: Emergency Medicine | Admitting: Emergency Medicine

## 2021-09-01 ENCOUNTER — Encounter: Payer: Self-pay | Admitting: Emergency Medicine

## 2021-09-01 DIAGNOSIS — N23 Unspecified renal colic: Secondary | ICD-10-CM | POA: Insufficient documentation

## 2021-09-01 DIAGNOSIS — Z79899 Other long term (current) drug therapy: Secondary | ICD-10-CM | POA: Insufficient documentation

## 2021-09-01 DIAGNOSIS — R319 Hematuria, unspecified: Secondary | ICD-10-CM | POA: Diagnosis not present

## 2021-09-01 DIAGNOSIS — I10 Essential (primary) hypertension: Secondary | ICD-10-CM | POA: Diagnosis not present

## 2021-09-01 DIAGNOSIS — Z87891 Personal history of nicotine dependence: Secondary | ICD-10-CM | POA: Insufficient documentation

## 2021-09-01 DIAGNOSIS — Z85820 Personal history of malignant melanoma of skin: Secondary | ICD-10-CM | POA: Insufficient documentation

## 2021-09-01 DIAGNOSIS — R3129 Other microscopic hematuria: Secondary | ICD-10-CM

## 2021-09-01 DIAGNOSIS — R350 Frequency of micturition: Secondary | ICD-10-CM | POA: Diagnosis present

## 2021-09-01 DIAGNOSIS — Z7982 Long term (current) use of aspirin: Secondary | ICD-10-CM | POA: Diagnosis not present

## 2021-09-01 LAB — CBC
HCT: 40.1 % (ref 39.0–52.0)
Hemoglobin: 13.6 g/dL (ref 13.0–17.0)
MCH: 30.8 pg (ref 26.0–34.0)
MCHC: 33.9 g/dL (ref 30.0–36.0)
MCV: 90.9 fL (ref 80.0–100.0)
Platelets: 287 10*3/uL (ref 150–400)
RBC: 4.41 MIL/uL (ref 4.22–5.81)
RDW: 13.2 % (ref 11.5–15.5)
WBC: 7.7 10*3/uL (ref 4.0–10.5)
nRBC: 0 % (ref 0.0–0.2)

## 2021-09-01 LAB — URINALYSIS, COMPLETE (UACMP) WITH MICROSCOPIC
Bacteria, UA: NONE SEEN
Bilirubin Urine: NEGATIVE
Glucose, UA: NEGATIVE mg/dL
Ketones, ur: NEGATIVE mg/dL
Leukocytes,Ua: NEGATIVE
Nitrite: NEGATIVE
Protein, ur: NEGATIVE mg/dL
RBC / HPF: >50 RBC/hpf — ABNORMAL HIGH (ref 0–5)
Specific Gravity, Urine: 1.019 (ref 1.005–1.030)
Squamous Epithelial / HPF: NONE SEEN (ref 0–5)
pH: 6 (ref 5.0–8.0)

## 2021-09-01 LAB — BASIC METABOLIC PANEL
Anion gap: 8 (ref 5–15)
BUN: 31 mg/dL — ABNORMAL HIGH (ref 8–23)
CO2: 30 mmol/L (ref 22–32)
Calcium: 9.5 mg/dL (ref 8.9–10.3)
Chloride: 101 mmol/L (ref 98–111)
Creatinine, Ser: 1.28 mg/dL — ABNORMAL HIGH (ref 0.61–1.24)
GFR, Estimated: 56 mL/min — ABNORMAL LOW (ref >60)
Glucose, Bld: 99 mg/dL (ref 70–99)
Potassium: 4.2 mmol/L (ref 3.5–5.1)
Sodium: 139 mmol/L (ref 135–145)

## 2021-09-01 NOTE — ED Triage Notes (Signed)
Pt comes into the ED via POV c/o urinary retention. Pt states that this started yesterday.  Pt also is having swelling in bilateral ankles.  Pt denies any CHF history.  Pt states he is able to get small amounts of urine out, but then feels like he has to go again.  Pt in NAD at this time with even and unlabored respirations. Pt denies any burning with urination.

## 2021-09-01 NOTE — ED Provider Notes (Signed)
[facility] Emergency Department Provider Note  ____________________________________________  Time seen: Approximately 7:37 PM  I have reviewed the triage vital signs and the nursing notes.   HISTORY  Chief Complaint Urinary Retention and Back Pain    HPI Jay [name] is a 80 y.o. male with history of hypertension, kidney stones who comes ED complaining of right lower back pain since yesterday which is waxing and waning, intermittent, no aggravating or alleviating factors, now resolved.  Associated with urinary frequency.  No dysuria hematuria or urgency.  No history of enlarged prostate or urinary retention.  No lower extremity paresthesias or weakness.  No incontinence.    Past Medical History:  Diagnosis Date   Chest pain, unspecified    Hypertension    Kidney stone    Melanoma ([place]) 1983   Obesity    Other and unspecified hyperlipidemia    Other malaise and fatigue      Patient Active Problem List   Diagnosis Date Noted   Chest pain 08/25/2013   Other and unspecified hyperlipidemia    Hypertension      Past Surgical History:  Procedure Laterality Date   BUNIONECTOMY     CATARACT EXTRACTION W/PHACO Left 08/15/2016   Procedure: CATARACT EXTRACTION PHACO AND INTRAOCULAR LENS PLACEMENT ([place]);  Surgeon: Birder Robson, MD;  Location: [facility] ORS;  Service: Ophthalmology;  Laterality: Left;  [place] 00:47 AP% 17.8 CDE 8.46 Fluid pack lot # [id number]   EYE SURGERY     HERNIA REPAIR     SKIN SURGERY     melanoma     Prior to Admission medications   Medication Sig Start Date End Date Taking? Authorizing Provider  amLODipine (NORVASC) 2.5 MG tablet Take 5 mg by mouth daily.     [provider]  Ascorbic Acid (VITAMIN C) 1000 MG tablet Take 1,000 mg by mouth daily.    [provider]  aspirin 81 MG tablet Take 81 mg by mouth daily.    [provider]  BIOTIN PO Take 500 mg by mouth daily.    [provider]   Calcium Carbonate-Vitamin D (CALCIUM-VITAMIN D) 500-200 MG-UNIT per tablet Take 1 tablet by mouth 2 (two) times daily with a meal.    [provider]  Coenzyme Q10-Vitamin E 100-300 MG-UNIT WAFR Take by mouth daily.    [provider]  Javier Docker Oil 1000 MG CAPS Take 500 mg by mouth daily.     [provider]  losartan-hydrochlorothiazide (HYZAAR) 100-12.5 MG per tablet Take 1 tablet by mouth daily.    [provider]  Multiple Vitamin (MULTIVITAMIN) tablet Take 1 tablet by mouth daily.    [provider]  niacin 500 MG tablet Take 500 mg by mouth daily with breakfast.    [provider]     Allergies Patient has no known allergies.   Family History  Problem Relation Age of Onset   Heart attack Mother    Hypertension Mother    Heart attack Father    Hypertension Father     Social History Social History   Tobacco Use   Smoking status: Former    Packs/day: 1.00    Years: 30.00    Pack years: 30.00    Types: Cigarettes  Substance Use Topics   Alcohol use: Yes    Comment: occassional   Drug use: No    Review of Systems  Constitutional:   No fever or chills.  ENT:   No sore throat. No rhinorrhea.  Cardiovascular:   No chest pain or syncope. Respiratory:   No dyspnea or cough. Gastrointestinal:   Negative for abdominal pain, vomiting and diarrhea.  Musculoskeletal:   Positive right low back pain as above.  Positive for bilateral ankle swelling. All other systems reviewed and are negative except as documented above in ROS and HPI.  ____________________________________________   PHYSICAL EXAM:  VITAL SIGNS: ED Triage Vitals  Enc Vitals Group     BP 09/01/21 1612 133/73     Pulse Rate 09/01/21 1612 69     Resp 09/01/21 1612 20     Temp 09/01/21 1612 98.4 F (36.9 C)     Temp Source 09/01/21 1612 Oral     SpO2 09/01/21 1612 98 %     Weight 09/01/21 1612 209 lb (94.8 kg)     Height 09/01/21 1612 5\' 7"  (1.702 m)      Head Circumference --      Peak Flow --      Pain Score 09/01/21 1618 0     Pain Loc --      Pain Edu? --      Excl. in [place]? --     Vital signs reviewed, nursing assessments reviewed.   Constitutional:   Alert and oriented. Non-toxic appearance. Eyes:   Conjunctivae are normal. EOMI. PERRL. ENT      Head:   Normocephalic and atraumatic.      Nose:   Wearing a mask.      Mouth/Throat:   Wearing a mask.      Neck:   No meningismus. Full ROM. Hematological/Lymphatic/Immunilogical:   No cervical lymphadenopathy. Cardiovascular:   RRR. Symmetric bilateral radial and DP pulses.  No murmurs. Cap refill less than 2 seconds. Respiratory:   Normal respiratory effort without tachypnea/retractions. Breath sounds are clear and equal bilaterally. No wheezes/rales/rhonchi. Gastrointestinal:   Soft and nontender. Non distended. There is no CVA tenderness.  No rebound, rigidity, or guarding. Genitourinary:   deferred Musculoskeletal:   Normal range of motion in all extremities. No joint effusions.  No lower extremity tenderness.  No appreciable lower extremity edema. Neurologic:   Normal speech and language.  Motor grossly intact. No acute focal neurologic deficits are appreciated.  Skin:    Skin is warm, dry and intact. No rash noted.  No petechiae, purpura, or bullae.  ____________________________________________    LABS (pertinent positives/negatives) (all labs ordered are listed, but only abnormal results are displayed) Labs Reviewed  URINALYSIS, COMPLETE (UACMP) WITH MICROSCOPIC - Abnormal; Notable for the following components:      Result Value   Color, Urine YELLOW (*)    APPearance HAZY (*)    Hgb urine dipstick SMALL (*)    RBC / HPF >50 (*)    All other components within normal limits  BASIC METABOLIC PANEL - Abnormal; Notable for the following components:   BUN 31 (*)    Creatinine, Ser 1.28 (*)    GFR, Estimated 56 (*)    All other components within normal limits  CBC    ____________________________________________   EKG    ____________________________________________    RADIOLOGY  No results found.  ____________________________________________   PROCEDURES Procedures  ____________________________________________  DIFFERENTIAL DIAGNOSIS   Dehydration, cystitis, renal colic, renal insufficiency, bladder outlet obstruction/enlarged prostate  CLINICAL IMPRESSION / ASSESSMENT AND PLAN / ED COURSE  Medications ordered in the ED: Medications - No data to display  Pertinent labs & imaging results that were available during my care of the patient were reviewed  by me and considered in my medical decision making (see chart for details).  Brentt Fread Cabral was evaluated in Emergency Department on 09/01/2021 for the symptoms described in the history of present illness. He was evaluated in the context of the global COVID-19 pandemic, which necessitated consideration that the patient might be at risk for infection with the SARS-CoV-2 virus that causes COVID-19. Institutional protocols and algorithms that pertain to the evaluation of patients at risk for COVID-19 are in a state of rapid change based on information released by regulatory bodies including the CDC and federal and state organizations. These policies and algorithms were followed during the patient's care in the ED.   Patient presents with complaint of ankle swelling and urinary retention.  After spontaneously voiding, PVR is low.  Creatinine unremarkable ruling out renal failure.  No significant lower extremity edema, no orthopnea or exertional symptoms, doubt CHF.  Doubt ACS PE dissection AAA or central cord syndrome/cauda equina.  Patient does have some hematuria on urinalysis without evidence of infection.  Most likely this is due to renal colic especially given his history.  Symptoms have now resolved, recommend vigilant hydration and symptom monitoring.  Discussed with the patient the  possibility of obtaining imaging, he is comfortable deferring given the resolution of his symptoms.  He will follow-up with primary care for repeat urinalysis to ensure clearance of the hematuria.      ____________________________________________   FINAL CLINICAL IMPRESSION(S) / ED DIAGNOSES    Final diagnoses:  Renal colic on right side  Microscopic hematuria     ED Discharge Orders     None       Portions of this note were generated with dragon dictation software. Dictation errors may occur despite best attempts at proofreading.    Carrie Mew, MD 09/06/21 [phone]

## 2021-09-19 DIAGNOSIS — M25531 Pain in right wrist: Secondary | ICD-10-CM | POA: Diagnosis not present

## 2021-09-19 DIAGNOSIS — M25532 Pain in left wrist: Secondary | ICD-10-CM | POA: Diagnosis not present

## 2021-09-19 DIAGNOSIS — M79645 Pain in left finger(s): Secondary | ICD-10-CM | POA: Diagnosis not present

## 2022-01-05 ENCOUNTER — Other Ambulatory Visit (HOSPITAL_COMMUNITY): Payer: Self-pay | Admitting: Family Medicine

## 2022-01-05 ENCOUNTER — Other Ambulatory Visit: Payer: Self-pay | Admitting: Family Medicine

## 2022-01-05 DIAGNOSIS — I714 Abdominal aortic aneurysm, without rupture, unspecified: Secondary | ICD-10-CM | POA: Diagnosis not present

## 2022-01-05 DIAGNOSIS — N1831 Chronic kidney disease, stage 3a: Secondary | ICD-10-CM | POA: Diagnosis not present

## 2022-01-05 DIAGNOSIS — R1084 Generalized abdominal pain: Secondary | ICD-10-CM | POA: Diagnosis not present

## 2022-01-16 ENCOUNTER — Other Ambulatory Visit: Payer: Self-pay

## 2022-01-16 ENCOUNTER — Ambulatory Visit
Admission: RE | Admit: 2022-01-16 | Discharge: 2022-01-16 | Disposition: A | Discharge status: Home / Self Care | Payer: PPO | Source: Ambulatory Visit | Attending: Family Medicine | Admitting: Family Medicine

## 2022-01-16 DIAGNOSIS — I714 Abdominal aortic aneurysm, without rupture, unspecified: Secondary | ICD-10-CM | POA: Diagnosis not present

## 2022-01-16 DIAGNOSIS — I700 Atherosclerosis of aorta: Secondary | ICD-10-CM | POA: Diagnosis not present

## 2022-02-01 ENCOUNTER — Encounter: Payer: Self-pay | Admitting: Gastroenterology

## 2022-03-13 ENCOUNTER — Other Ambulatory Visit (INDEPENDENT_AMBULATORY_CARE_PROVIDER_SITE_OTHER): Payer: PPO

## 2022-03-13 ENCOUNTER — Ambulatory Visit: Payer: PPO | Admitting: Gastroenterology

## 2022-03-13 ENCOUNTER — Encounter: Payer: Self-pay | Admitting: Gastroenterology

## 2022-03-13 DIAGNOSIS — R195 Other fecal abnormalities: Secondary | ICD-10-CM

## 2022-03-13 DIAGNOSIS — A048 Other specified bacterial intestinal infections: Secondary | ICD-10-CM | POA: Diagnosis not present

## 2022-03-13 DIAGNOSIS — Q453 Other congenital malformations of pancreas and pancreatic duct: Secondary | ICD-10-CM | POA: Diagnosis not present

## 2022-03-13 LAB — COMPREHENSIVE METABOLIC PANEL
ALT: 17 U/L (ref 0–53)
AST: 21 U/L (ref 0–37)
Albumin: 4.5 g/dL (ref 3.5–5.2)
Alkaline Phosphatase: 42 U/L (ref 39–117)
BUN: 29 mg/dL — ABNORMAL HIGH (ref 6–23)
CO2: 28 mEq/L (ref 19–32)
Calcium: 9.6 mg/dL (ref 8.4–10.5)
Chloride: 104 mEq/L (ref 96–112)
Creatinine, Ser: 1.41 mg/dL (ref 0.40–1.50)
GFR: 46.59 mL/min — ABNORMAL LOW (ref >60.00)
Glucose, Bld: 108 mg/dL — ABNORMAL HIGH (ref 70–99)
Potassium: 4 mEq/L (ref 3.5–5.1)
Sodium: 140 mEq/L (ref 135–145)
Total Bilirubin: 0.7 mg/dL (ref 0.2–1.2)
Total Protein: 7.4 g/dL (ref 6.0–8.3)

## 2022-03-13 LAB — CBC
HCT: 41.9 % (ref 39.0–52.0)
Hemoglobin: 14.2 g/dL (ref 13.0–17.0)
MCHC: 33.9 g/dL (ref 30.0–36.0)
MCV: 89.1 fl (ref 78.0–100.0)
Platelets: 317 10*3/uL (ref 150.0–400.0)
RBC: 4.7 Mil/uL (ref 4.22–5.81)
RDW: 14.3 % (ref 11.5–15.5)
WBC: 7.7 10*3/uL (ref 4.0–10.5)

## 2022-03-13 LAB — SEDIMENTATION RATE: Sed Rate: 30 mm/hr — ABNORMAL HIGH (ref 0–20)

## 2022-03-13 LAB — C-REACTIVE PROTEIN: CRP: <1 mg/dL (ref 0.5–20.0)

## 2022-03-13 MED ORDER — LOPERAMIDE HCL 2 MG PO TABS: 2.0000 mg | ORAL_TABLET | Freq: Every morning | ORAL | 0 refills | Status: DC

## 2022-03-13 NOTE — Progress Notes (Signed)
? ?HPI: ?This is a very pleasant 81 year old Jay [name] who was referred to me by Derinda Late, MD  to evaluate chronic loose stools, intermittent worsening.   ? ?For 20 to 25 years he has had difficulty with his bowels.  Specifically he will always have loose stools about 95 to 99% of the time he will have 1-3 loose stools every morning.  Intermittently, about once to twice per year he will have severe episodes of several weeks of much worse diarrhea as well as lower abdominal discomforts.  He will go 10-12 times per day for several weeks during these episodes. ? ?He tells me he has had numerous doctors test him in numerous ways over the past 20 to 25 years and he has never found out what is going on. ? ?At one point in [place] he was told he had celiac sprue and so he avoided gluten for several years without any improvement.  He recently was told he did not have celiac sprue without any testing performed so he restarted gluten. ? ?At 1 point he was found to have a pancreatic cyst in 2018.  This was never followed up on. ? ?Colon cancer does not run in his family ? ?He does not drink much coffee or any alcohol. ? ?He underwent a colonoscopy about 3 years ago and met then.  He does not know what was found and he does not have a copy of the report ? ? ?Old Data Reviewed: ?12/2021 ultrasound of aorta shows atherosclerosis of the abdominal aorta without evidence of aneurysm. ? ?MRI abdomen 12/2016 indication "pancreatic lesion seen on recent chest CT".  Findings 2 cm cystic lesion in pancreatic tail with differential diagnosis including sidebranch IPMN and serous cystadenoma."  The reading radiologist suggested repeating imaging in 6 months or performing an endoscopic ultrasound.  I will think either of those were done. ? ?Blood work 08/2021 CBC was normal ? ? ? ?Review of systems: ?Pertinent positive and negative review of systems were noted in the above HPI section. All other review negative. ? ? ?Past Medical History:   ?Diagnosis Date  ? Chest pain, unspecified   ? Hypertension   ? Kidney stone   ? Melanoma ([place]) 1983  ? Obesity   ? Other and unspecified hyperlipidemia   ? Other malaise and fatigue   ? ? ?Past Surgical History:  ?Procedure Laterality Date  ? BUNIONECTOMY    ? CATARACT EXTRACTION W/PHACO Left 08/15/2016  ? Procedure: CATARACT EXTRACTION PHACO AND INTRAOCULAR LENS PLACEMENT (IOC);  Surgeon: Birder Robson, MD;  Location: [facility] ORS;  Service: Ophthalmology;  Laterality: Left;  [place] 00:47 ?AP% 17.8 ?CDE 8.46 ?Fluid pack lot # [id number]  ? EYE SURGERY    ? HERNIA REPAIR    ? SKIN SURGERY    ? melanoma  ? ? ?Current Outpatient Medications  ?Medication Instructions  ? amitriptyline (ELAVIL) 25 mg, Oral, Daily at bedtime  ? amLODipine (NORVASC) 5 mg, Oral, Daily  ? BIOTIN PO 500 mg, Daily  ? Calcium Carbonate-Vitamin D (CALCIUM-VITAMIN D) 500-200 MG-UNIT per tablet 1 tablet, 2 times daily with meals  ? Coenzyme Q10-Vitamin E 100-300 MG-UNIT WAFR Daily  ? gabapentin (NEURONTIN) 600 MG tablet Oral  ? losartan-hydrochlorothiazide (HYZAAR) 100-12.5 MG per tablet 1 tablet, Daily  ? Multiple Vitamin (MULTIVITAMIN) tablet 1 tablet, Daily  ? NEXLETOL 180 MG TABS 1 tablet, Oral, Daily  ? vitamin C 1,000 mg, Daily  ? ? ?Allergies as of 03/13/2022  ? (No Known Allergies)  ? ? ?  Family History  ?Problem Relation Age of Onset  ? Heart attack Mother   ? Hypertension Mother   ? Heart attack Father   ? Hypertension Father   ? Colon cancer Neg Hx   ? Stomach cancer Neg Hx   ? Esophageal cancer Neg Hx   ? ? ?Social History  ? ?Socioeconomic History  ? Marital status: Married  ?  Spouse name: Not on file  ? Number of children: Not on file  ? Years of education: Not on file  ? Highest education level: Not on file  ?Occupational History  ? Not on file  ?Tobacco Use  ? Smoking status: Former  ?  Packs/day: 1.00  ?  Years: 30.00  ?  Pack years: 30.00  ?  Types: Cigarettes  ? Smokeless tobacco: Not on file  ?Vaping Use  ? Vaping Use: Never used   ?Substance and Sexual Activity  ? Alcohol use: Yes  ?  Comment: occassional  ? Drug use: No  ? Sexual activity: Not on file  ?Other Topics Concern  ? Not on file  ?Social History Narrative  ? Not on file  ? ?Social Determinants of Health  ? ?Financial Resource Strain: Not on file  ?Food Insecurity: Not on file  ?Transportation Needs: Not on file  ?Physical Activity: Not on file  ?Stress: Not on file  ?Social Connections: Not on file  ?Intimate Partner Violence: Not on file  ? ? ? ?Physical Exam: ?Ht $RemoveB'5\' 7"'EZThLqMe$  (1.702 m)   Wt 200 lb (90.7 kg)   BMI 31.32 kg/m?  ?Constitutional: generally well-appearing ?Psychiatric: alert and oriented x3 ?Eyes: extraocular movements intact ?Mouth: oral pharynx moist, no lesions ?Neck: supple no lymphadenopathy ?Cardiovascular: heart regular rate and rhythm ?Lungs: clear to auscultation bilaterally ?Abdomen: soft, nontender, nondistended, no obvious ascites, no peritoneal signs, normal bowel sounds ?Extremities: no lower extremity edema bilaterally ?Skin: no lesions on visible extremities ? ? ?Assessment and plan: ?81 y.o. male with chronic loose stools, abnormal pancreas in 2018 ? ?First he has had issues with his GI tract for 20 to 25 years.  He has undergone multiple tests through multiple providers and never been found to have an exact answer.  I explained that I am certainly happy to try and understand what is going on again but given his history with 2-3 decades of issues successfully being able to explain his issues are admittedly low ? ?He had a colonoscopy 3 years ago by different provider in a different town.  They are going to see if they can find the results of that and send them to him.  They do not even remember the name of the doctor that did not and they certainly do not remember what was found ? ?I recommend he start taking a single Imodium shortly after waking every single morning ? ?I recommend we follow-up this pancreatic abnormality found in 2018 with a CT scan  abdomen pelvis pancreatic protocol. ? ?I recommended a battery of blood work and some stool testing including CBC, complete metabolic, thyroid testing, sedimentation rate, celiac sprue testing, GI pathogen panel. ? ? ?Please see the "Patient Instructions" section for addition details about the plan. ? ? ?Owens Loffler, MD ?[facility] ?03/13/2022, 10:Jay AM ? ?Cc: Derinda Late, MD ? ?Total time on date of encounter was 46  minutes (this included time spent preparing to see the patient reviewing records; obtaining and/or reviewing separately obtained history; performing a medically appropriate exam and/or evaluation; counseling and educating the patient  and family if present; ordering medications, tests or procedures if applicable; and documenting clinical information in the health record). ? ? ?

## 2022-03-13 NOTE — Patient Instructions (Addendum)
If you are age 82 or older, your body mass index should be between 23-30. Your Body mass index is 31.32 kg/m?Marland Kitchen If this is out of the aforementioned range listed, please consider follow up with your Primary Care Provider. ?________________________________________________________ ? ?The [facility] providers would like to encourage you to use [facility] to communicate with providers for non-urgent requests or questions.  Due to long hold times on the telephone, sending your provider a message by [facility] may be a faster and more efficient way to get a response.  Please allow 48 business hours for a response.  Please remember that this is for non-urgent requests.  ?_______________________________________________________ ? ?Your provider has requested that you go to the basement level for lab work before leaving today. Press "B" on the elevator. The lab is located at the first door on the left as you exit the elevator. ? ?DISCONTINUE: Probiotics.  If no improvement in 2 weeks then please purchase the following medications over the counter and take as directed: ? ?START: Imodium take one tablet daily every morning. ? ?Please obtain your colonoscopy records from [facility] and bring to our office for our review. ? ?You have been scheduled for a CT scan of the abdomen and pelvis at [facility], 1st floor Radiology. You are scheduled on 03-22-22  at [place] should arrive 30 minutes prior to your appointment time for registration.  Please pick up 2 bottles of contrast from [place] at least 3 days prior to your scan. The solution may taste better if refrigerated, but do NOT add ice or any other liquid to this solution. Shake well before drinking.  ? ?Please follow the written instructions below on the day of your exam:  ? ?1) Do not eat anything after 5am (4 hours prior to your test)  ? ?2) Drink 1 bottle of contrast @ 7am (2 hours prior to your exam)  Remember to shake well before drinking and do NOT pour over ice. ?     Drink 1 bottle of contrast @ 8am (1 hour prior to your exam)  ? ?You may take any medications as prescribed with a small amount of water, if necessary. If you take any of the following medications: METFORMIN, GLUCOPHAGE, GLUCOVANCE, AVANDAMET, RIOMET, FORTAMET, ACTOPLUS MET, JANUMET, [place] or METAGLIP, you MAY be asked to HOLD this medication 48 hours AFTER the exam.  ? ?The purpose of you drinking the oral contrast is to aid in the visualization of your intestinal tract. The contrast solution may cause some diarrhea. Depending on your individual set of symptoms, you may also receive an intravenous injection of x-ray contrast/dye. Plan on being at [facility] for 45 minutes or longer, depending on the type of exam you are having performed.  ? ?If you have any questions regarding your exam or if you need to reschedule, you may call [facility] at [phone] between the hours of 8:00 am and 5:00 pm, Monday-Friday.  ? ?Due to recent changes in healthcare laws, you may see the results of your imaging and laboratory studies on MyChart before your provider has had a chance to review them.  We understand that in some cases there may be results that are confusing or concerning to you. Not all laboratory results come back in the same time frame and the provider may be waiting for multiple results in order to interpret others.  Please give [place] 48 hours in order for your provider to thoroughly review all the results before contacting  the office for clarification of your results.  ? ?Thank you for entrusting me with your care and choosing [facility]. ? ?Dr Ardis Hughs ? ?

## 2022-03-14 ENCOUNTER — Other Ambulatory Visit: Payer: PPO

## 2022-03-14 DIAGNOSIS — Q453 Other congenital malformations of pancreas and pancreatic duct: Secondary | ICD-10-CM | POA: Diagnosis not present

## 2022-03-14 DIAGNOSIS — R195 Other fecal abnormalities: Secondary | ICD-10-CM | POA: Diagnosis not present

## 2022-03-14 DIAGNOSIS — A048 Other specified bacterial intestinal infections: Secondary | ICD-10-CM | POA: Diagnosis not present

## 2022-03-14 LAB — IGA: Immunoglobulin A: 161 mg/dL (ref 70–320)

## 2022-03-14 LAB — TISSUE TRANSGLUTAMINASE, IGA: (tTG) Ab, IgA: <1 U/mL

## 2022-03-16 ENCOUNTER — Telehealth: Payer: Self-pay | Admitting: Gastroenterology

## 2022-03-16 ENCOUNTER — Other Ambulatory Visit: Payer: Self-pay

## 2022-03-16 ENCOUNTER — Telehealth: Payer: Self-pay

## 2022-03-16 LAB — GI PROFILE, STOOL, PCR

## 2022-03-16 LAB — H. PYLORI ANTIGEN, STOOL: H pylori Ag, Stl: NEGATIVE

## 2022-03-16 MED ORDER — VANCOMYCIN HCL 125 MG PO CAPS: 125.0000 mg | ORAL_CAPSULE | Freq: Four times a day (QID) | ORAL | 0 refills | Status: DC

## 2022-03-16 NOTE — Telephone Encounter (Signed)
Patient notified of lab results per Dr Ardis Hughs.  Rx for vacnomyocin '125mg'$  one tablet 4 times daily sent to pharmacy as requested.  Patient agreed to plan and verbalized understanding.  No further questions. ?

## 2022-03-16 NOTE — Telephone Encounter (Signed)
Received call from [employer] stating that pt tested positive for C. Diff but it looks like you've already seen the result.  ?

## 2022-03-16 NOTE — Telephone Encounter (Signed)
Patient is returning your call.  

## 2022-03-17 LAB — OVA AND PARASITE EXAMINATION
CONCENTRATE RESULT:: NONE SEEN
MICRO NUMBER:: 13278381
SPECIMEN QUALITY:: ADEQUATE
TRICHROME RESULT:: NONE SEEN

## 2022-03-20 LAB — PANCREATIC ELASTASE, FECAL: Pancreatic Elastase-1, Stool: 407 mcg/g

## 2022-03-22 ENCOUNTER — Encounter (HOSPITAL_COMMUNITY): Payer: Self-pay

## 2022-03-22 ENCOUNTER — Ambulatory Visit (HOSPITAL_COMMUNITY)
Admission: RE | Admit: 2022-03-22 | Discharge: 2022-03-22 | Disposition: A | Discharge status: Home / Self Care | Payer: PPO | Source: Ambulatory Visit | Attending: Gastroenterology | Admitting: Gastroenterology

## 2022-03-22 DIAGNOSIS — Q453 Other congenital malformations of pancreas and pancreatic duct: Secondary | ICD-10-CM | POA: Diagnosis not present

## 2022-03-22 DIAGNOSIS — N200 Calculus of kidney: Secondary | ICD-10-CM | POA: Diagnosis not present

## 2022-03-22 DIAGNOSIS — K869 Disease of pancreas, unspecified: Secondary | ICD-10-CM | POA: Diagnosis not present

## 2022-03-22 DIAGNOSIS — N281 Cyst of kidney, acquired: Secondary | ICD-10-CM | POA: Diagnosis not present

## 2022-03-22 MED ORDER — IOHEXOL 300 MG/ML  SOLN
100.0000 mL | Freq: Once | INTRAMUSCULAR | Status: AC | PRN
  Administered 2022-03-22: 100 mL via INTRAVENOUS

## 2022-03-22 MED ORDER — SODIUM CHLORIDE (PF) 0.9 % IJ SOLN
INTRAMUSCULAR | Status: AC
  Filled 2022-03-22: qty 50

## 2022-04-28 ENCOUNTER — Encounter: Payer: Self-pay | Admitting: Gastroenterology

## 2022-09-19 DIAGNOSIS — M25532 Pain in left wrist: Secondary | ICD-10-CM | POA: Diagnosis not present

## 2022-09-19 DIAGNOSIS — M25531 Pain in right wrist: Secondary | ICD-10-CM | POA: Diagnosis not present

## 2022-09-19 DIAGNOSIS — M79645 Pain in left finger(s): Secondary | ICD-10-CM | POA: Diagnosis not present

## 2022-09-19 DIAGNOSIS — G5732 Lesion of lateral popliteal nerve, left lower limb: Secondary | ICD-10-CM | POA: Diagnosis not present

## 2022-09-22 DIAGNOSIS — Z79899 Other long term (current) drug therapy: Secondary | ICD-10-CM | POA: Diagnosis not present

## 2022-09-22 DIAGNOSIS — N1831 Chronic kidney disease, stage 3a: Secondary | ICD-10-CM | POA: Diagnosis not present

## 2022-09-22 DIAGNOSIS — F5101 Primary insomnia: Secondary | ICD-10-CM | POA: Diagnosis not present

## 2022-09-22 DIAGNOSIS — I714 Abdominal aortic aneurysm, without rupture, unspecified: Secondary | ICD-10-CM | POA: Diagnosis not present

## 2022-09-22 DIAGNOSIS — I10 Essential (primary) hypertension: Secondary | ICD-10-CM | POA: Diagnosis not present

## 2022-11-02 DIAGNOSIS — H353131 Nonexudative age-related macular degeneration, bilateral, early dry stage: Secondary | ICD-10-CM | POA: Diagnosis not present

## 2022-11-02 DIAGNOSIS — H40013 Open angle with borderline findings, low risk, bilateral: Secondary | ICD-10-CM | POA: Diagnosis not present

## 2022-12-07 DIAGNOSIS — H40013 Open angle with borderline findings, low risk, bilateral: Secondary | ICD-10-CM | POA: Diagnosis not present

## 2022-12-20 DIAGNOSIS — R200 Anesthesia of skin: Secondary | ICD-10-CM | POA: Diagnosis not present

## 2022-12-20 DIAGNOSIS — M79645 Pain in left finger(s): Secondary | ICD-10-CM | POA: Diagnosis not present

## 2022-12-20 DIAGNOSIS — R202 Paresthesia of skin: Secondary | ICD-10-CM | POA: Diagnosis not present

## 2022-12-20 DIAGNOSIS — M25532 Pain in left wrist: Secondary | ICD-10-CM | POA: Diagnosis not present

## 2022-12-20 DIAGNOSIS — M25531 Pain in right wrist: Secondary | ICD-10-CM | POA: Diagnosis not present

## 2023-02-13 DIAGNOSIS — R200 Anesthesia of skin: Secondary | ICD-10-CM | POA: Diagnosis not present

## 2023-02-13 DIAGNOSIS — R202 Paresthesia of skin: Secondary | ICD-10-CM | POA: Diagnosis not present

## 2023-02-21 ENCOUNTER — Telehealth: Payer: Self-pay

## 2023-02-21 DIAGNOSIS — Q453 Other congenital malformations of pancreas and pancreatic duct: Secondary | ICD-10-CM

## 2023-02-21 NOTE — Addendum Note (Signed)
Addended by: Stevan Born on: 02/21/2023 03:03 PM   Modules accepted: Orders

## 2023-02-21 NOTE — Telephone Encounter (Signed)
That will be fine. Pancreas Protocol CT-Abdomen, since he did not want an MRI. Thanks. GM

## 2023-02-21 NOTE — Telephone Encounter (Signed)
Left message for patient to return call to further discuss CT abd (pancreatic protocol)scan scheduled for 03-26-23 at 10:30am at [facility].  Patient should arrive at 10:15am and NPO 4 hours prior.  Will continue efforts.

## 2023-02-21 NOTE — Telephone Encounter (Signed)
Dr Rush Landmark  This patient needs repeat CT scan in April per Dr Ardis Hughs, dx cyst of pancreas tail.  Will this be OK to order under your name?  Thank you, Elmyra Ricks

## 2023-02-21 NOTE — Telephone Encounter (Signed)
-----   Message from Stevan Born, [place] sent at 03/24/2022  2:28 PM EDT ----- Regarding: CT scan Pt needs repeat CT scan in 1 year per Dr Ardis Hughs, dx cyst of pancreas tail

## 2023-02-22 NOTE — Telephone Encounter (Signed)
Patient advised that he has been scheduled for a CT abd (pancreatic protocol)scan on 03-26-23 at 10:30am at [facility]. Patient advised to arrive at 10:15am and NPO 4 hours prior.  Patient agreed to plan and verbalized understanding.  No further questions.

## 2023-02-27 DIAGNOSIS — M79645 Pain in left finger(s): Secondary | ICD-10-CM | POA: Diagnosis not present

## 2023-02-27 DIAGNOSIS — R202 Paresthesia of skin: Secondary | ICD-10-CM | POA: Diagnosis not present

## 2023-02-27 DIAGNOSIS — G5602 Carpal tunnel syndrome, left upper limb: Secondary | ICD-10-CM | POA: Diagnosis not present

## 2023-02-27 DIAGNOSIS — R200 Anesthesia of skin: Secondary | ICD-10-CM | POA: Diagnosis not present

## 2023-03-19 DIAGNOSIS — G5602 Carpal tunnel syndrome, left upper limb: Secondary | ICD-10-CM | POA: Diagnosis not present

## 2023-03-19 DIAGNOSIS — M25542 Pain in joints of left hand: Secondary | ICD-10-CM | POA: Diagnosis not present

## 2023-03-19 DIAGNOSIS — M1812 Unilateral primary osteoarthritis of first carpometacarpal joint, left hand: Secondary | ICD-10-CM | POA: Diagnosis not present

## 2023-03-26 ENCOUNTER — Ambulatory Visit (HOSPITAL_COMMUNITY)
Admission: RE | Admit: 2023-03-26 | Discharge: 2023-03-26 | Disposition: A | Discharge status: Home / Self Care | Payer: PPO | Source: Ambulatory Visit | Attending: Gastroenterology | Admitting: Gastroenterology

## 2023-03-26 DIAGNOSIS — Q453 Other congenital malformations of pancreas and pancreatic duct: Secondary | ICD-10-CM | POA: Diagnosis not present

## 2023-03-26 DIAGNOSIS — N281 Cyst of kidney, acquired: Secondary | ICD-10-CM | POA: Diagnosis not present

## 2023-03-26 DIAGNOSIS — K5730 Diverticulosis of large intestine without perforation or abscess without bleeding: Secondary | ICD-10-CM | POA: Diagnosis not present

## 2023-03-26 MED ORDER — SODIUM CHLORIDE (PF) 0.9 % IJ SOLN
INTRAMUSCULAR | Status: AC
  Filled 2023-03-26: qty 50

## 2023-03-26 MED ORDER — IOHEXOL 300 MG/ML  SOLN
100.0000 mL | Freq: Once | INTRAMUSCULAR | Status: AC | PRN
  Administered 2023-03-26: 100 mL via INTRAVENOUS

## 2023-04-09 DIAGNOSIS — G589 Mononeuropathy, unspecified: Secondary | ICD-10-CM | POA: Diagnosis not present

## 2023-04-09 DIAGNOSIS — E669 Obesity, unspecified: Secondary | ICD-10-CM | POA: Diagnosis not present

## 2023-04-09 DIAGNOSIS — Z87891 Personal history of nicotine dependence: Secondary | ICD-10-CM | POA: Diagnosis not present

## 2023-04-09 DIAGNOSIS — M1990 Unspecified osteoarthritis, unspecified site: Secondary | ICD-10-CM | POA: Diagnosis not present

## 2023-04-09 DIAGNOSIS — G4700 Insomnia, unspecified: Secondary | ICD-10-CM | POA: Diagnosis not present

## 2023-04-09 DIAGNOSIS — E785 Hyperlipidemia, unspecified: Secondary | ICD-10-CM | POA: Diagnosis not present

## 2023-04-09 DIAGNOSIS — I10 Essential (primary) hypertension: Secondary | ICD-10-CM | POA: Diagnosis not present

## 2023-04-09 DIAGNOSIS — F17200 Nicotine dependence, unspecified, uncomplicated: Secondary | ICD-10-CM | POA: Diagnosis not present

## 2023-04-09 DIAGNOSIS — K589 Irritable bowel syndrome without diarrhea: Secondary | ICD-10-CM | POA: Diagnosis not present

## 2023-04-09 DIAGNOSIS — Z9849 Cataract extraction status, unspecified eye: Secondary | ICD-10-CM | POA: Diagnosis not present

## 2023-06-21 ENCOUNTER — Emergency Department
Admission: EM | Admit: 2023-06-21 | Discharge: 2023-06-21 | Disposition: A | Discharge status: Home / Self Care | Payer: PPO | Source: Home / Self Care | Attending: Emergency Medicine | Admitting: Emergency Medicine

## 2023-06-21 ENCOUNTER — Emergency Department: Payer: PPO

## 2023-06-21 ENCOUNTER — Encounter: Payer: Self-pay | Admitting: *Deleted

## 2023-06-21 DIAGNOSIS — R42 Dizziness and giddiness: Secondary | ICD-10-CM | POA: Insufficient documentation

## 2023-06-21 DIAGNOSIS — I10 Essential (primary) hypertension: Secondary | ICD-10-CM | POA: Diagnosis not present

## 2023-06-21 DIAGNOSIS — R630 Anorexia: Secondary | ICD-10-CM | POA: Insufficient documentation

## 2023-06-21 DIAGNOSIS — R5383 Other fatigue: Secondary | ICD-10-CM | POA: Diagnosis not present

## 2023-06-21 DIAGNOSIS — E860 Dehydration: Secondary | ICD-10-CM | POA: Insufficient documentation

## 2023-06-21 DIAGNOSIS — N281 Cyst of kidney, acquired: Secondary | ICD-10-CM | POA: Diagnosis not present

## 2023-06-21 DIAGNOSIS — R110 Nausea: Secondary | ICD-10-CM | POA: Insufficient documentation

## 2023-06-21 DIAGNOSIS — K5730 Diverticulosis of large intestine without perforation or abscess without bleeding: Secondary | ICD-10-CM | POA: Diagnosis not present

## 2023-06-21 DIAGNOSIS — R509 Fever, unspecified: Secondary | ICD-10-CM | POA: Diagnosis not present

## 2023-06-21 DIAGNOSIS — K8020 Calculus of gallbladder without cholecystitis without obstruction: Secondary | ICD-10-CM | POA: Diagnosis not present

## 2023-06-21 DIAGNOSIS — I771 Stricture of artery: Secondary | ICD-10-CM | POA: Diagnosis not present

## 2023-06-21 LAB — CBC WITH DIFFERENTIAL/PLATELET
Abs Immature Granulocytes: 0.07 10*3/uL (ref 0.00–0.07)
Basophils Absolute: 0 10*3/uL (ref 0.0–0.1)
Basophils Relative: 0 %
Eosinophils Absolute: 0.1 10*3/uL (ref 0.0–0.5)
Eosinophils Relative: 1 %
HCT: 41.4 % (ref 39.0–52.0)
Hemoglobin: 14 g/dL (ref 13.0–17.0)
Immature Granulocytes: 1 %
Lymphocytes Relative: 9 %
Lymphs Abs: 1.3 10*3/uL (ref 0.7–4.0)
MCH: 29.7 pg (ref 26.0–34.0)
MCHC: 33.8 g/dL (ref 30.0–36.0)
MCV: 87.9 fL (ref 80.0–100.0)
Monocytes Absolute: 1.1 10*3/uL — ABNORMAL HIGH (ref 0.1–1.0)
Monocytes Relative: 8 %
Neutro Abs: 11.5 10*3/uL — ABNORMAL HIGH (ref 1.7–7.7)
Neutrophils Relative %: 81 %
Platelets: 472 10*3/uL — ABNORMAL HIGH (ref 150–400)
RBC: 4.71 MIL/uL (ref 4.22–5.81)
RDW: 12.3 % (ref 11.5–15.5)
WBC: 14.1 10*3/uL — ABNORMAL HIGH (ref 4.0–10.5)
nRBC: 0 % (ref 0.0–0.2)

## 2023-06-21 LAB — COMPREHENSIVE METABOLIC PANEL
ALT: 32 U/L (ref 0–44)
AST: 41 U/L (ref 15–41)
Albumin: 4 g/dL (ref 3.5–5.0)
Alkaline Phosphatase: 41 U/L (ref 38–126)
Anion gap: 14 (ref 5–15)
BUN: 35 mg/dL — ABNORMAL HIGH (ref 8–23)
CO2: 23 mmol/L (ref 22–32)
Calcium: 9.3 mg/dL (ref 8.9–10.3)
Chloride: 98 mmol/L (ref 98–111)
Creatinine, Ser: 1.49 mg/dL — ABNORMAL HIGH (ref 0.61–1.24)
GFR, Estimated: 46 mL/min — ABNORMAL LOW (ref >60)
Glucose, Bld: 113 mg/dL — ABNORMAL HIGH (ref 70–99)
Potassium: 3.6 mmol/L (ref 3.5–5.1)
Sodium: 135 mmol/L (ref 135–145)
Total Bilirubin: 0.9 mg/dL (ref 0.3–1.2)
Total Protein: 8.7 g/dL — ABNORMAL HIGH (ref 6.5–8.1)

## 2023-06-21 LAB — URINALYSIS, ROUTINE W REFLEX MICROSCOPIC
Bacteria, UA: NONE SEEN
Bilirubin Urine: NEGATIVE
Glucose, UA: NEGATIVE mg/dL
Hgb urine dipstick: NEGATIVE
Ketones, ur: 5 mg/dL — AB
Leukocytes,Ua: NEGATIVE
Nitrite: NEGATIVE
Protein, ur: 30 mg/dL — AB
Specific Gravity, Urine: 1.023 (ref 1.005–1.030)
pH: 5 (ref 5.0–8.0)

## 2023-06-21 LAB — LACTIC ACID, PLASMA: Lactic Acid, Venous: 1.3 mmol/L (ref 0.5–1.9)

## 2023-06-21 MED ORDER — IOHEXOL 300 MG/ML  SOLN
100.0000 mL | Freq: Once | INTRAMUSCULAR | Status: AC | PRN
  Administered 2023-06-21: 100 mL via INTRAVENOUS

## 2023-06-21 MED ORDER — SODIUM CHLORIDE 0.9 % IV BOLUS
1000.0000 mL | Freq: Once | INTRAVENOUS | Status: AC
  Administered 2023-06-21: 1000 mL via INTRAVENOUS

## 2023-06-21 MED ORDER — ONDANSETRON 4 MG PO TBDP: 4.0000 mg | ORAL_TABLET | Freq: Three times a day (TID) | ORAL | 0 refills | Status: AC | PRN

## 2023-06-21 MED ORDER — ONDANSETRON HCL 4 MG/2ML IJ SOLN
4.0000 mg | Freq: Once | INTRAMUSCULAR | Status: AC
  Administered 2023-06-21: 4 mg via INTRAVENOUS
  Filled 2023-06-21: qty 2

## 2023-06-21 NOTE — ED Notes (Signed)
Ambulatory to b/r, steady gait, denies light headedness or dizziness.

## 2023-06-21 NOTE — ED Notes (Signed)
Pt tolerate crackers and water without nausea/vomiting.

## 2023-06-21 NOTE — ED Triage Notes (Signed)
BIB spouse from [place] PCP with report of orthostatic and concern for dehydration. Endorses: dizzy with standing, weight loss, decreased appetite, decreased PO intake, muscle spasms, fever, fatigue, body aches. Sx ongoing for ~ 2 weeks. Fever present in triage. Alert, NAD, calm, interactive.

## 2023-06-21 NOTE — ED Provider Notes (Signed)
[facility] Provider Note    Event Date/Time   First MD Initiated Contact with Patient 06/21/23 2049     (approximate)   History   Chief Complaint: Fever   HPI  Jay [name] is a 82 y.o. male with a history of hypertension who comes ED complaining of dizziness with standing, decreased appetite for the past week or 2.  Also reports intermittent fatigue, chills.  Denies any pain.  Does endorse nausea.  No vomiting, no diarrhea.  Has taken 3 COVID test during this time which were all negative.     Physical Exam   Triage Vital Signs: ED Triage Vitals  Encounter Vitals Group     BP 06/21/23 1741 110/68     Systolic BP Percentile --      Diastolic BP Percentile --      Pulse Rate 06/21/23 1741 92     Resp 06/21/23 1741 18     Temp 06/21/23 1741 100.1 F (37.8 C)     Temp Source 06/21/23 1741 Oral     SpO2 06/21/23 1741 96 %     Weight 06/21/23 1742 199 lb (90.3 kg)     Height 06/21/23 1742 5\' 8"  (1.727 m)     Head Circumference --      Peak Flow --      Pain Score 06/21/23 1742 2     Pain Loc --      Pain Education --      Exclude from Growth Chart --     Most recent vital signs: Vitals:   06/21/23 2130 06/21/23 2230  BP: 110/67 111/78  Pulse: 81 88  Resp: 14 (!) 22  Temp:    SpO2: 98% 98%    General: Awake, no distress.  CV:  Good peripheral perfusion.  Regular rate and rhythm Resp:  Normal effort.  Clear to auscultation bilaterally Abd:  No distention.  Soft, nontender Other:  Dry mucous membranes.  No rash, no lower extremity edema.   ED Results / Procedures / Treatments   Labs (all labs ordered are listed, but only abnormal results are displayed) Labs Reviewed  COMPREHENSIVE METABOLIC PANEL - Abnormal; Notable for the following components:      Result Value   Glucose, Bld 113 (*)    BUN 35 (*)    Creatinine, Ser 1.49 (*)    Total Protein 8.7 (*)    GFR, Estimated 46 (*)    All other components within normal limits   CBC WITH DIFFERENTIAL/PLATELET - Abnormal; Notable for the following components:   WBC 14.1 (*)    Platelets 472 (*)    Neutro Abs 11.5 (*)    Monocytes Absolute 1.1 (*)    All other components within normal limits  URINALYSIS, ROUTINE W REFLEX MICROSCOPIC - Abnormal; Notable for the following components:   Color, Urine YELLOW (*)    APPearance CLEAR (*)    Ketones, ur 5 (*)    Protein, ur 30 (*)    All other components within normal limits  LACTIC ACID, PLASMA     EKG Interpreted by me Normal sinus rhythm rate of 83.  Normal axis intervals QRS ST segments and T waves.   RADIOLOGY Chest x-ray interpreted by me, appears normal.  Radiology report reviewed.  CT abdomen pelvis unremarkable.   PROCEDURES:  Procedures   MEDICATIONS ORDERED IN ED: Medications  ondansetron (ZOFRAN) injection 4 mg (4 mg Intravenous Given 06/21/23 2157)  sodium chloride 0.9 % bolus 1,000  mL (0 mLs Intravenous Stopped 06/21/23 2300)  iohexol (OMNIPAQUE) 300 MG/ML solution 100 mL (100 mLs Intravenous Contrast Given 06/21/23 2144)     IMPRESSION / MDM / ASSESSMENT AND PLAN / ED COURSE  I reviewed the triage vital signs and the nursing notes.  DDx: Gastritis, viral syndrome, dehydration, electrolyte abnormality, anemia, pancreatitis, bowel obstruction  Patient's presentation is most consistent with acute presentation with potential threat to life or bodily function.  Patient presents with p.o. intolerance and decreased oral intake.  No abdominal pain.  Doubt aortic dissection AAA or mesenteric ischemia.  Has a mild leukocytosis, labs otherwise unrevealing..  With his GI oriented symptoms, will obtain CT abdomen pelvis.   Clinical Course as of 06/21/23 2319  Thu Jun 21, 2023  2304 Patient feels better, ambulatory, denies any complaints currently.  CT unremarkable.  Stable for discharge [PS]    Clinical Course User Index [PS] Sharman Cheek, MD     FINAL CLINICAL IMPRESSION(S) / ED  DIAGNOSES   Final diagnoses:  Dehydration     Rx / DC Orders   ED Discharge Orders          Ordered    ondansetron (ZOFRAN-ODT) 4 MG disintegrating tablet  Every 8 hours PRN        06/21/23 2306             Note:  This document was prepared using Dragon voice recognition software and may include unintentional dictation errors.   Sharman Cheek, MD 06/21/23 [phone]

## 2023-06-21 NOTE — ED Notes (Signed)
Patient transported to CT 

## 2023-06-21 NOTE — ED Notes (Signed)
First Nurse Note: Pt arrived from the [facility]. Pt has been Dizzy with fatigue when ambulating. Pt also has been having muscle spasms and lost 11lbs of weight in two weeks.

## 2023-06-25 DIAGNOSIS — N1831 Chronic kidney disease, stage 3a: Secondary | ICD-10-CM | POA: Diagnosis not present

## 2023-06-25 DIAGNOSIS — D72829 Elevated white blood cell count, unspecified: Secondary | ICD-10-CM | POA: Diagnosis not present

## 2023-06-25 DIAGNOSIS — R5383 Other fatigue: Secondary | ICD-10-CM | POA: Diagnosis not present

## 2023-06-25 DIAGNOSIS — M2550 Pain in unspecified joint: Secondary | ICD-10-CM | POA: Diagnosis not present

## 2023-06-25 DIAGNOSIS — I714 Abdominal aortic aneurysm, without rupture, unspecified: Secondary | ICD-10-CM | POA: Diagnosis not present

## 2023-06-27 ENCOUNTER — Telehealth: Payer: Self-pay

## 2023-06-27 NOTE — Telephone Encounter (Signed)
Transition Care Management Unsuccessful Follow-up Telephone Call  Date of discharge and from where:  [facility] 7/25  Attempts:  1st Attempt  Reason for unsuccessful TCM follow-up call:  No answer/busy   Lenard Forth [facility] Guide, [facility] [phone] [address] [place], [place], [place] [address] Phone: [phone] Email: [email or web address]

## 2023-06-28 ENCOUNTER — Telehealth: Payer: Self-pay

## 2023-06-28 NOTE — Telephone Encounter (Signed)
Transition Care Management Unsuccessful Follow-up Telephone Call  Date of discharge and from where:  [facility] 7/25  Attempts:  2nd Attempt  Reason for unsuccessful TCM follow-up call:  No answer/busy   Jay [name] [facility] Guide, [facility] [phone] [address] [place], [place], [place] [address] Phone: [phone] Email: [email or web address]

## 2023-08-01 DIAGNOSIS — N1831 Chronic kidney disease, stage 3a: Secondary | ICD-10-CM | POA: Diagnosis not present

## 2023-08-01 DIAGNOSIS — Z79899 Other long term (current) drug therapy: Secondary | ICD-10-CM | POA: Diagnosis not present

## 2023-08-01 DIAGNOSIS — I10 Essential (primary) hypertension: Secondary | ICD-10-CM | POA: Diagnosis not present

## 2023-08-03 DIAGNOSIS — Z0000 Encounter for general adult medical examination without abnormal findings: Secondary | ICD-10-CM | POA: Diagnosis not present

## 2023-08-03 DIAGNOSIS — Z1331 Encounter for screening for depression: Secondary | ICD-10-CM | POA: Diagnosis not present

## 2023-08-03 DIAGNOSIS — R7303 Prediabetes: Secondary | ICD-10-CM | POA: Diagnosis not present

## 2023-08-03 DIAGNOSIS — M353 Polymyalgia rheumatica: Secondary | ICD-10-CM | POA: Diagnosis not present

## 2023-08-03 DIAGNOSIS — I10 Essential (primary) hypertension: Secondary | ICD-10-CM | POA: Diagnosis not present

## 2023-11-27 DIAGNOSIS — N1831 Chronic kidney disease, stage 3a: Secondary | ICD-10-CM | POA: Diagnosis not present

## 2023-11-27 DIAGNOSIS — M353 Polymyalgia rheumatica: Secondary | ICD-10-CM | POA: Diagnosis not present

## 2023-11-27 DIAGNOSIS — R7303 Prediabetes: Secondary | ICD-10-CM | POA: Diagnosis not present

## 2023-11-27 DIAGNOSIS — I10 Essential (primary) hypertension: Secondary | ICD-10-CM | POA: Diagnosis not present

## 2024-01-01 DIAGNOSIS — I10 Essential (primary) hypertension: Secondary | ICD-10-CM | POA: Diagnosis not present

## 2024-01-01 DIAGNOSIS — M353 Polymyalgia rheumatica: Secondary | ICD-10-CM | POA: Diagnosis not present

## 2024-01-07 IMAGING — US US AORTA
1 series · 14 of 25 positions shown · non-contrast
Comparison: MRI of the abdomen on 01/01/2017

CLINICAL DATA: Abdominal aortic aneurysm. History of hypertension
and hyperlipidemia.

EXAM:
ULTRASOUND OF ABDOMINAL AORTA
TECHNIQUE: Ultrasound examination of the abdominal aorta and proximal common
iliac arteries was performed to evaluate for aneurysm. Additional
color and Doppler images of the distal aorta were obtained to
document patency.

[Series 1: us aorta · 0.36mm/px · 14 of 43 slices shown]
[im 1/43]
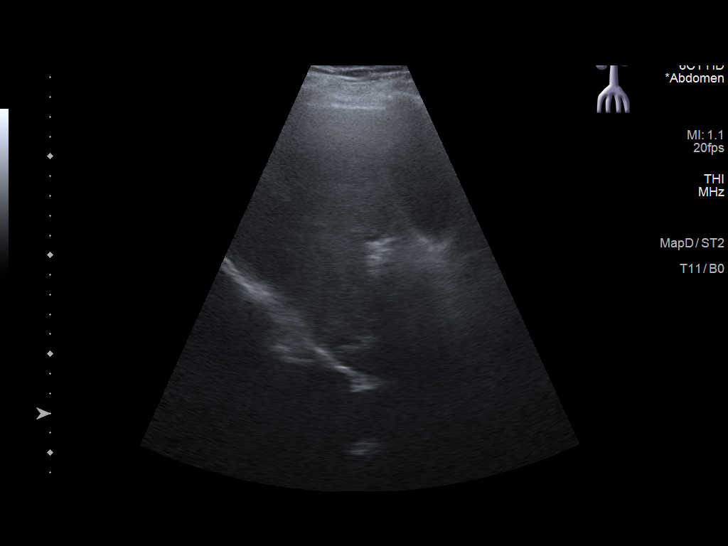
[im 4/43]
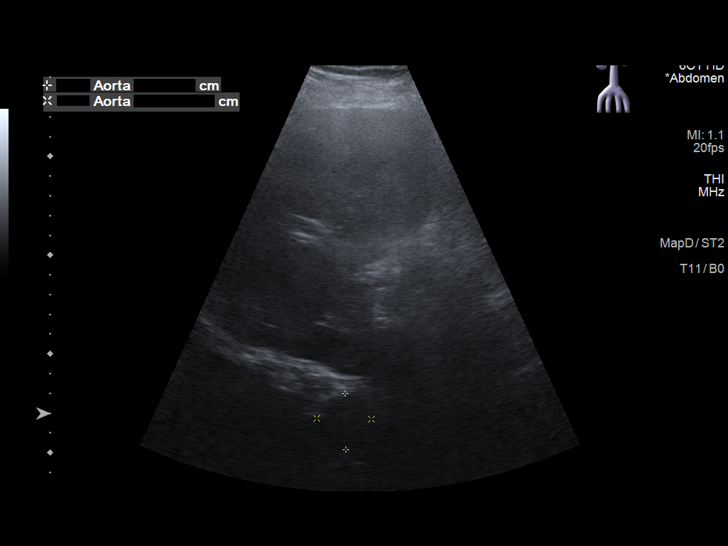
[im 8/43]
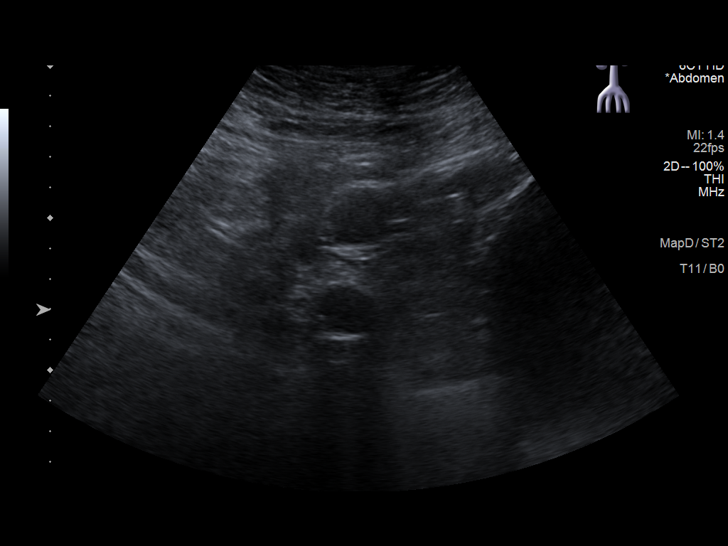
[im 11/43]
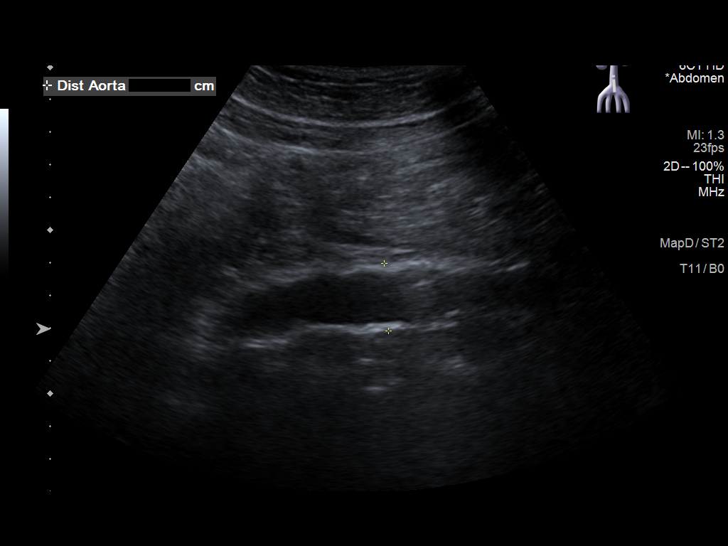
[im 15/43]
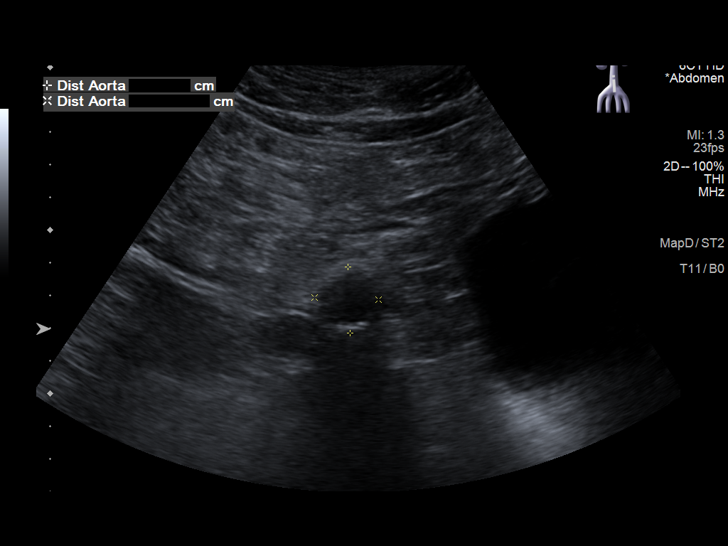
[im 16/43]
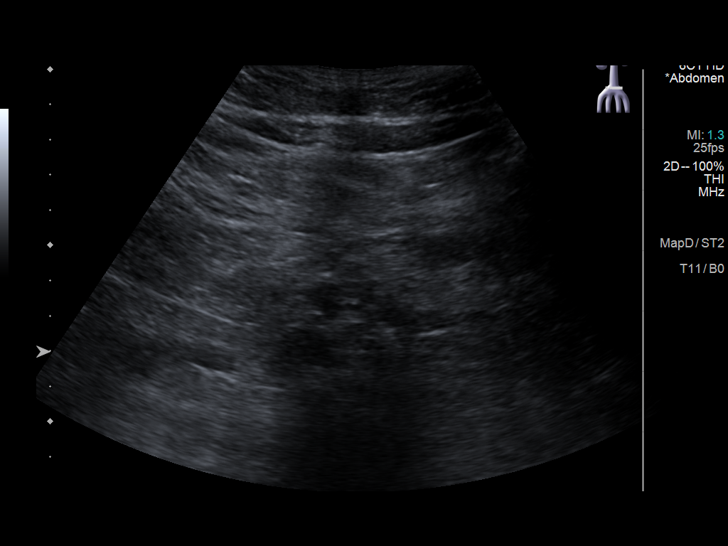
[im 20/43]
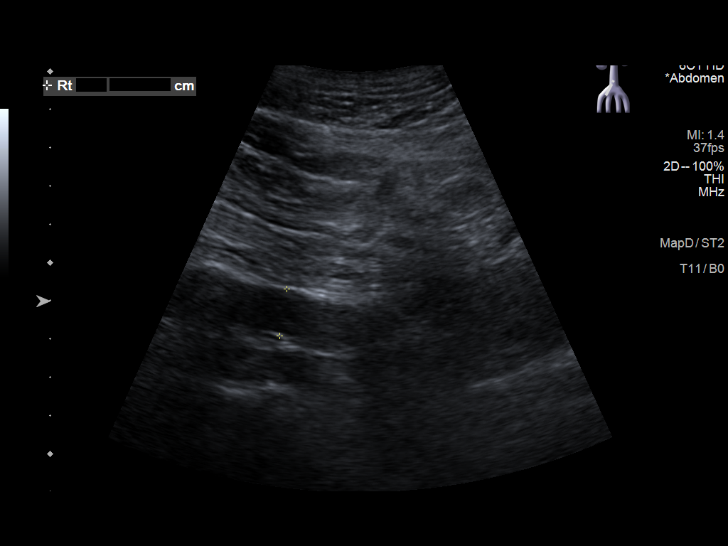
[im 23/43]
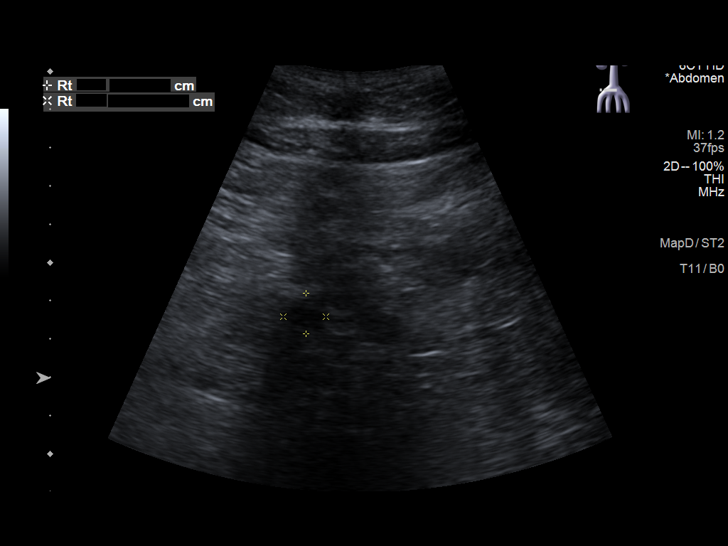
[im 27/43]
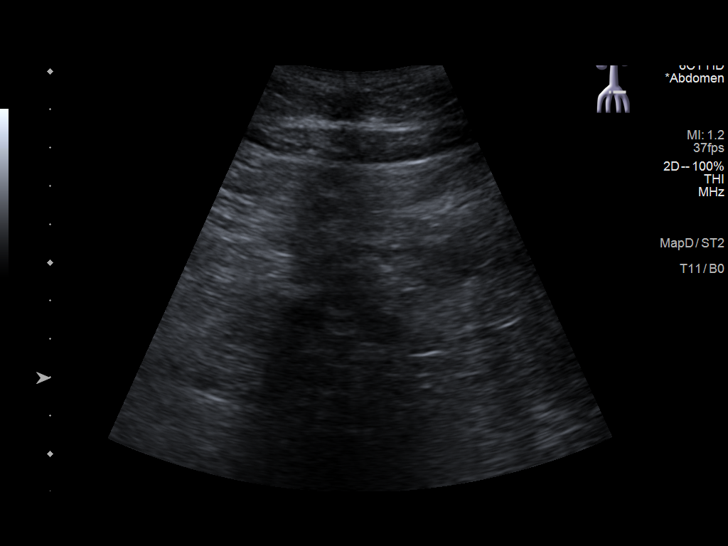
[im 29/43]
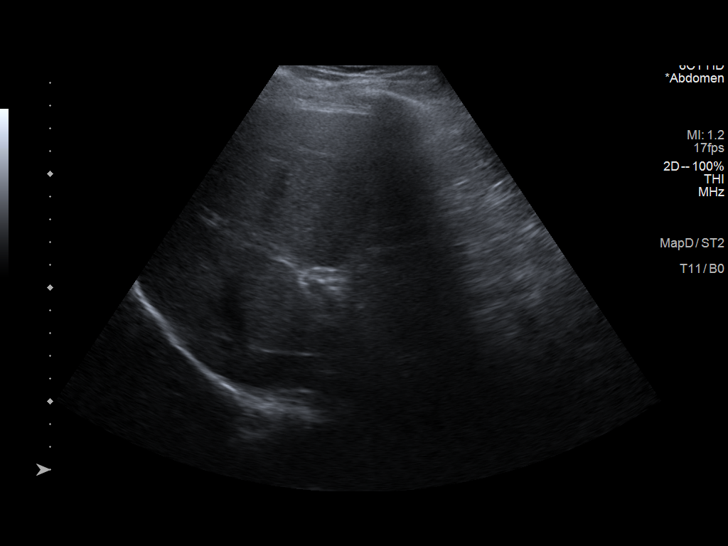
[im 32/43]
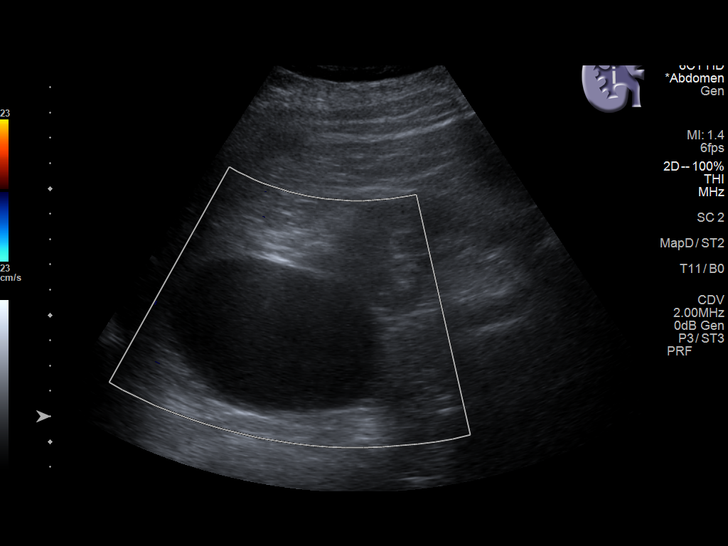
[im 36/43]
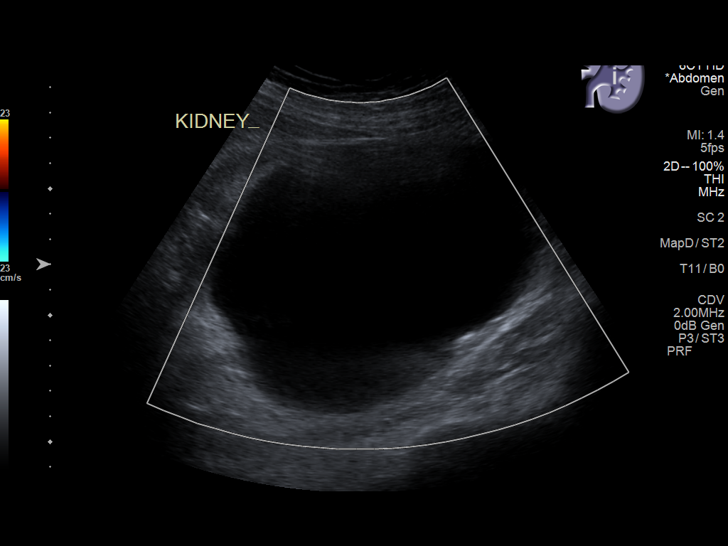
[im 39/43]
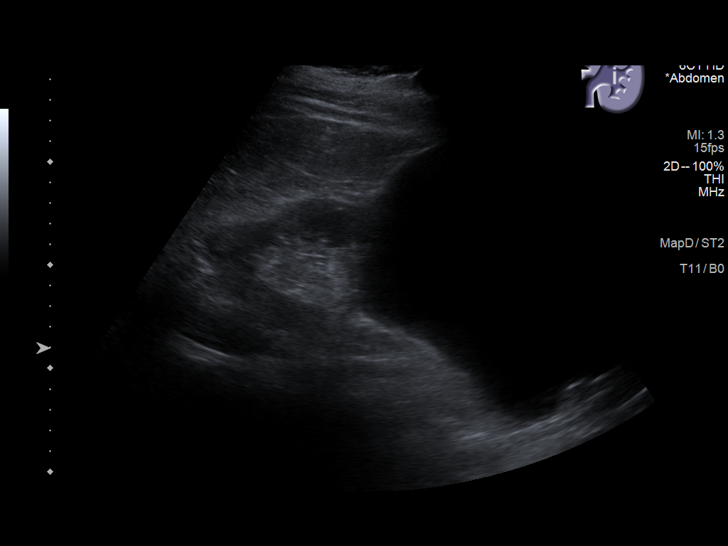
[im 43/43]
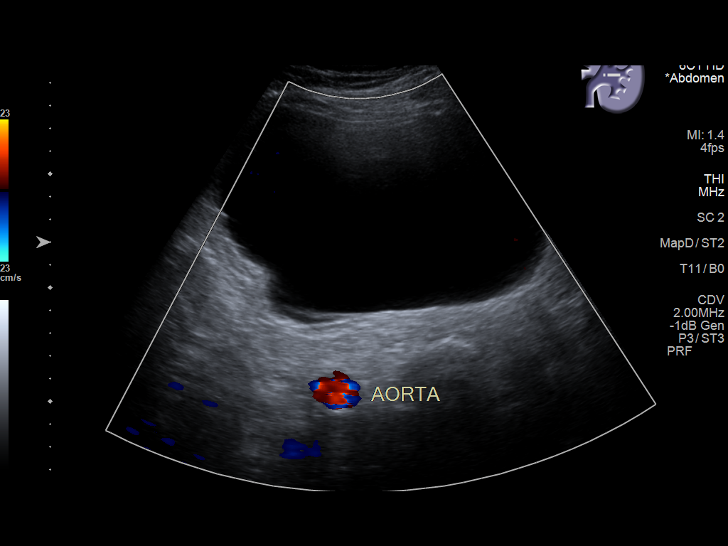

[14 of 25 positions shown; findings below may reference images not displayed]

FINDINGS: Abdominal aortic measurements as follows:

Proximal: 2.7 x 2.8 cm. Limited visualization of the proximal
abdominal aorta by ultrasound.

Mid:  2.1 x 2.1 cm

Distal: 2.0 x 2.1 cm. There is some plaque in the distal abdominal
aorta.
Patent: Yes, peak systolic velocity is 135 cm/s

Right common iliac artery: 1.2 cm

Left common iliac artery: 1.2 cm

Large bilateral renal cysts again noted including a 14 cm cyst of
the left kidney which was present by prior MRI.
IMPRESSION: Atherosclerosis of the abdominal aorta without evidence of
aneurysmal disease.

## 2024-01-08 DIAGNOSIS — M9901 Segmental and somatic dysfunction of cervical region: Secondary | ICD-10-CM | POA: Diagnosis not present

## 2024-01-08 DIAGNOSIS — M9903 Segmental and somatic dysfunction of lumbar region: Secondary | ICD-10-CM | POA: Diagnosis not present

## 2024-01-08 DIAGNOSIS — M9902 Segmental and somatic dysfunction of thoracic region: Secondary | ICD-10-CM | POA: Diagnosis not present

## 2024-01-08 DIAGNOSIS — M9904 Segmental and somatic dysfunction of sacral region: Secondary | ICD-10-CM | POA: Diagnosis not present

## 2024-02-25 DIAGNOSIS — M353 Polymyalgia rheumatica: Secondary | ICD-10-CM | POA: Diagnosis not present

## 2024-02-25 DIAGNOSIS — R5383 Other fatigue: Secondary | ICD-10-CM | POA: Diagnosis not present

## 2024-02-26 DIAGNOSIS — I10 Essential (primary) hypertension: Secondary | ICD-10-CM | POA: Diagnosis not present

## 2024-02-26 DIAGNOSIS — M353 Polymyalgia rheumatica: Secondary | ICD-10-CM | POA: Diagnosis not present

## 2024-02-26 DIAGNOSIS — N1831 Chronic kidney disease, stage 3a: Secondary | ICD-10-CM | POA: Diagnosis not present

## 2024-03-12 IMAGING — CT CT ABD-PELV W/ CM
2 of 5 series · 15 of 46 positions shown, 17 images · IV contrast (OMNIPAQUE)
Comparison: 01/01/2017 MRI

CLINICAL DATA: Pancreatic cystic mass on MRI 01/01/2017. Nausea,
diarrhea, and abdominal pain.

EXAM:
CT ABDOMEN AND PELVIS WITH CONTRAST
TECHNIQUE: Multidetector CT imaging of the abdomen and pelvis was performed
using the standard protocol following bolus administration of
intravenous contrast.

[Series 2: axial st · axial · 0.84mm/px · z∈[-468,-43]mm · 12 of 101 slices shown, 14 images]
[im 8/101  soft-tissue]
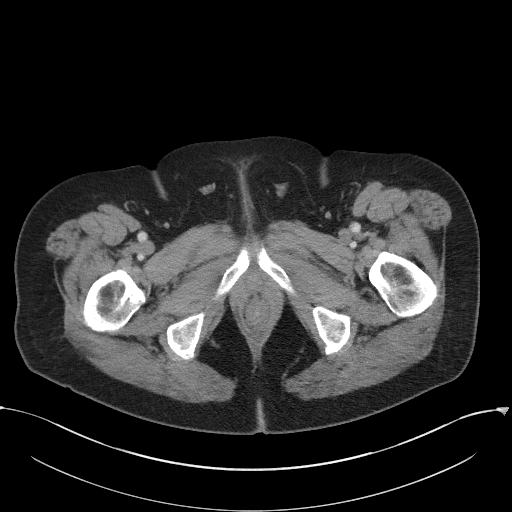
[im 8/101  bone]
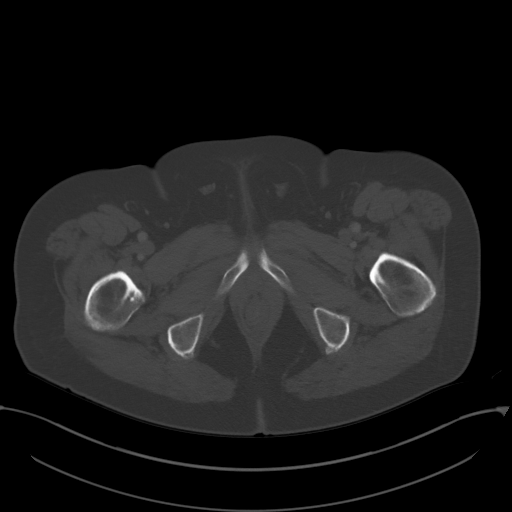
[im 15/101  soft-tissue]
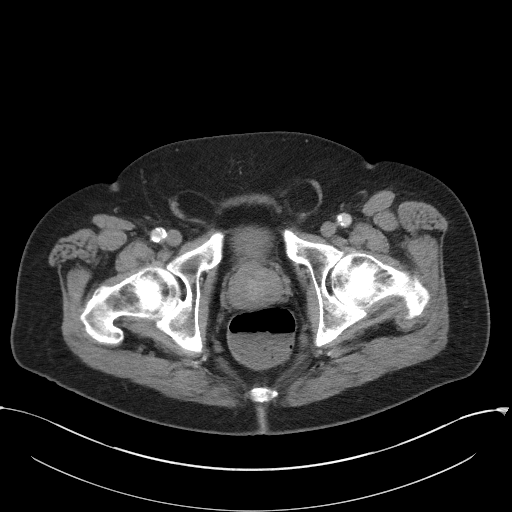
[im 22/101  soft-tissue]
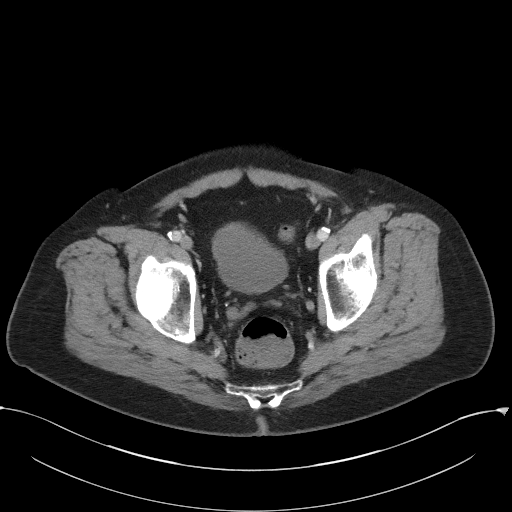
[im 29/101  soft-tissue]
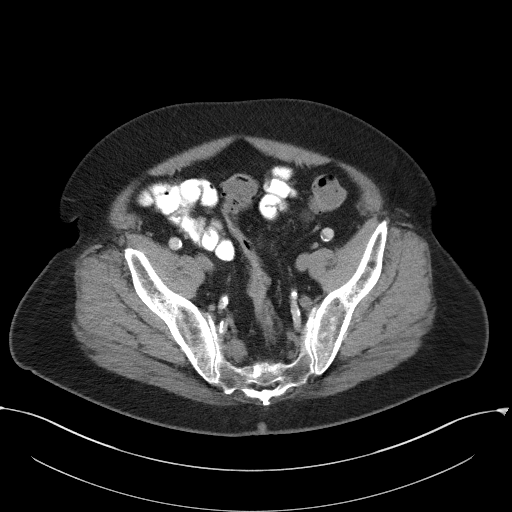
[im 36/101  soft-tissue]
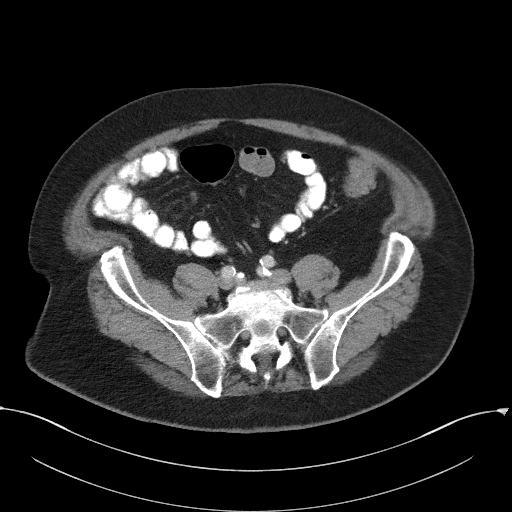
[im 43/101  soft-tissue]
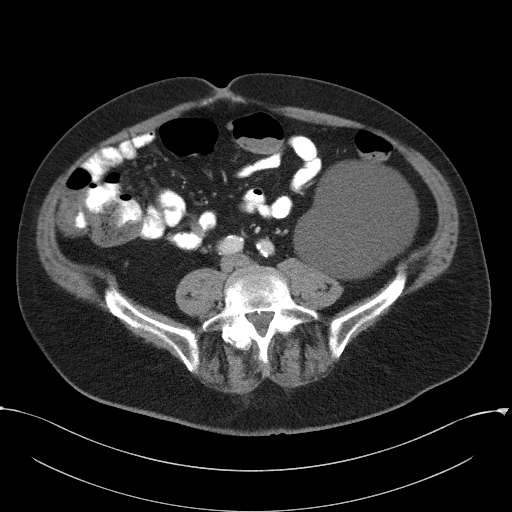
[im 58/101  soft-tissue]
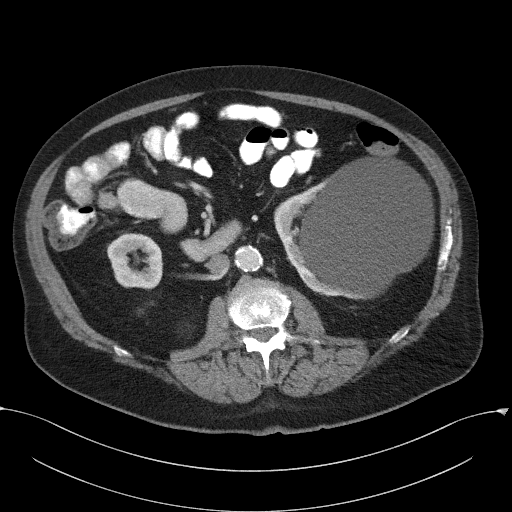
[im 65/101  soft-tissue]
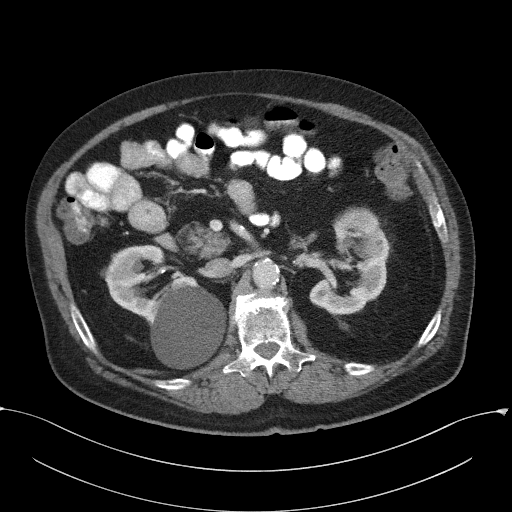
[im 72/101  soft-tissue]
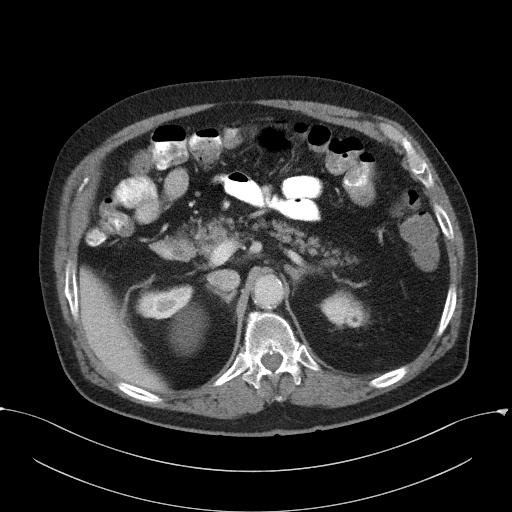
[im 72/101  bone]
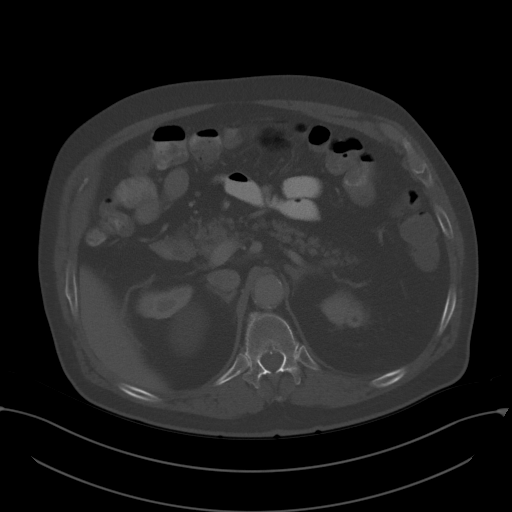
[im 79/101  soft-tissue]
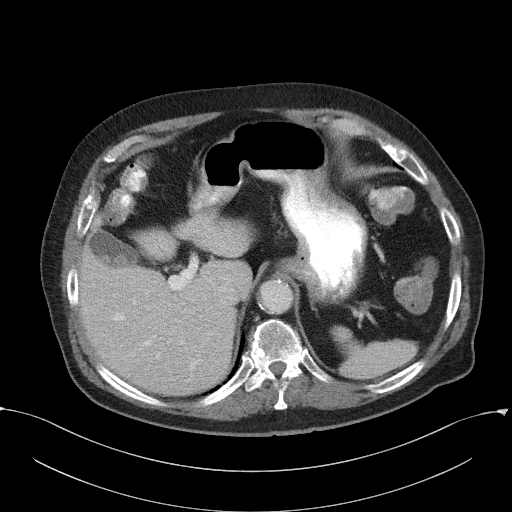
[im 86/101  soft-tissue]
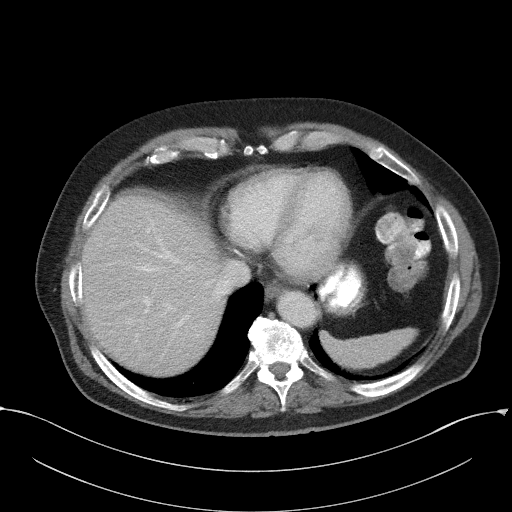
[im 93/101  soft-tissue]
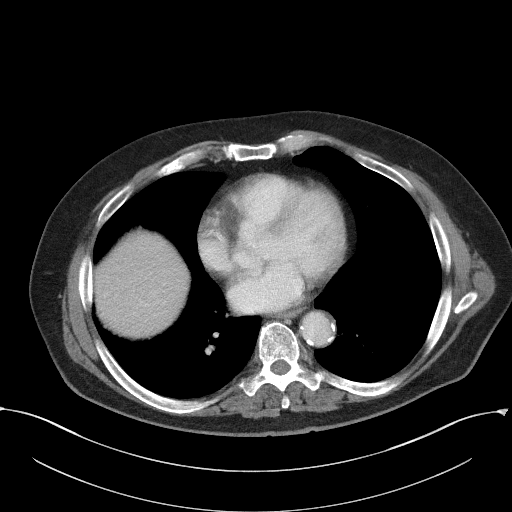

[Series 4: coronal st · coronal · 0.81mm/px · 3 of 103 slices shown]
[im 35/103  soft-tissue]
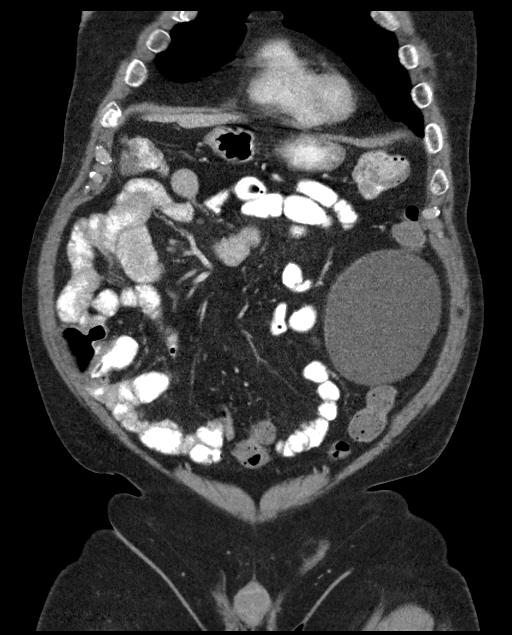
[im 46/103  soft-tissue]
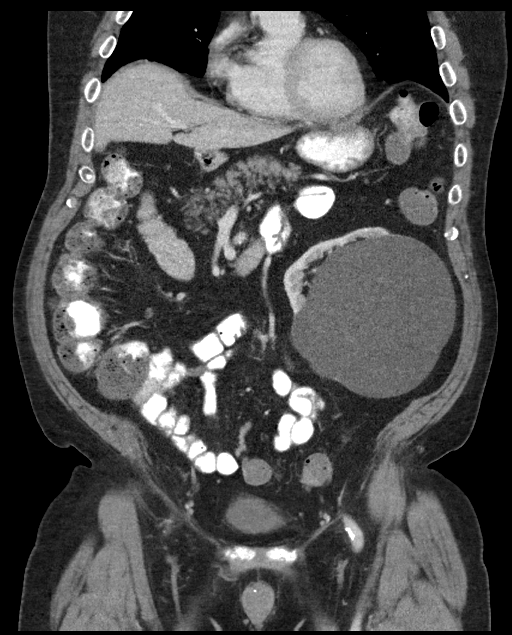
[im 57/103  soft-tissue]
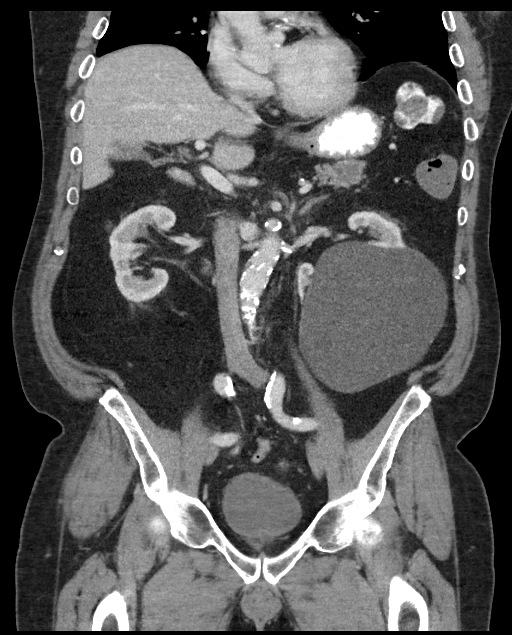

[15 of 46 positions shown; findings below may reference images not displayed]

RADIATION DOSE REDUCTION: This exam was performed according to the
departmental dose-optimization program which includes automated
exposure control, adjustment of the mA and/or kV according to
patient size and/or use of iterative reconstruction technique.

CONTRAST:  80 cc OMNIPAQUE IOHEXOL 300 MG/ML  SOLN
FINDINGS: Lower chest: Coronary and aortic atherosclerotic calcification.

Hepatobiliary: Dependent densities in the gallbladder favoring
gallstones for example on image 23 series 2. No focal hepatic
parenchymal lesion identified. No biliary dilatation noted.

Pancreas: 2.9 by 2.3 by 2.2 cm (volume = 7.7 cm^3) mass in the
pancreatic tail with low-density elements and higher density
internal septations. No dorsal pancreatic duct dilatation. This
lesion corresponds to the previous cystic lesion which formerly
measured 2.3 by 1.7 by 1.9 cm (volume = 3.9 cm^3). No current
peripancreatic edema.

Spleen: Unremarkable

Adrenals/Urinary Tract: Both adrenal glands appear normal. Bilateral
benign renal cysts are observed, most notably a 13.2 by 12.1 by
cm (volume = 4400 cm^3) left kidney lower pole cyst. These are
similar to the prior MRI and do not require follow up. Urinary
bladder unremarkable. No hydronephrosis. 2 mm left mid kidney
nonobstructive renal calculus on image 40 series 2. Vascular
calcification in the right renal hilum.

Stomach/Bowel: Unremarkable

Vascular/Lymphatic: Substantial atherosclerosis including the
abdominal aorta and its branches. No pathologic adenopathy
identified.

Reproductive: Mild prostatomegaly.

Other: No supplemental non-categorized findings.

Musculoskeletal: Lumbar spondylosis and degenerative disc disease
with mild grade 1 degenerative retrolisthesis at L1-2 and L3-4.
Suspected left foraminal impingement at L3-4 and suspected right
foraminal impingement at L3-4, L4-5, and L5-S1.

Small umbilical hernia mesh noted without recurrent hernia. However,
there are small bilateral direct inguinal hernias containing adipose
tissue.
IMPRESSION: 1. The mass in the pancreatic tail previously shown to be somewhat
microcystic on MRI has approximately doubled in volume from
01/01/2017 to today. Morphologically this previously most resembled
a serous cystadenoma. Other possibilities may include intraductal
papillary mucinous neoplasm or unusual pseudocyst. In light of the
enlargement, dedicated pancreatic protocol MRI with and without
contrast is recommended in order to re-evaluate internal
architecture and re-characterize.
2. Suspected cholelithiasis.
3. Benign bilateral renal cysts including some large cysts. These
are benign and do not themselves require follow up.
4. Multilevel lumbar impingement.
5. Small bilateral direct inguinal hernias contain adipose tissue.
6. Substantial atherosclerosis, including branches of the abdominal
aorta as well as the coronary arteries. Aortic Atherosclerosis
([id number].L).

## 2024-05-05 DIAGNOSIS — N1831 Chronic kidney disease, stage 3a: Secondary | ICD-10-CM | POA: Diagnosis not present

## 2024-05-05 DIAGNOSIS — M353 Polymyalgia rheumatica: Secondary | ICD-10-CM | POA: Diagnosis not present

## 2024-05-09 DIAGNOSIS — I10 Essential (primary) hypertension: Secondary | ICD-10-CM | POA: Diagnosis not present

## 2024-05-09 DIAGNOSIS — N1831 Chronic kidney disease, stage 3a: Secondary | ICD-10-CM | POA: Diagnosis not present

## 2024-05-09 DIAGNOSIS — R7303 Prediabetes: Secondary | ICD-10-CM | POA: Diagnosis not present

## 2024-05-09 DIAGNOSIS — M353 Polymyalgia rheumatica: Secondary | ICD-10-CM | POA: Diagnosis not present

## 2024-05-09 DIAGNOSIS — Z79899 Other long term (current) drug therapy: Secondary | ICD-10-CM | POA: Diagnosis not present

## 2024-06-18 DIAGNOSIS — H401120 Primary open-angle glaucoma, left eye, stage unspecified: Secondary | ICD-10-CM | POA: Diagnosis not present

## 2024-06-18 DIAGNOSIS — H34811 Central retinal vein occlusion, right eye, with macular edema: Secondary | ICD-10-CM | POA: Diagnosis not present

## 2024-06-19 DIAGNOSIS — H34811 Central retinal vein occlusion, right eye, with macular edema: Secondary | ICD-10-CM | POA: Diagnosis not present

## 2024-06-19 DIAGNOSIS — H401121 Primary open-angle glaucoma, left eye, mild stage: Secondary | ICD-10-CM | POA: Diagnosis not present

## 2024-06-19 DIAGNOSIS — Z961 Presence of intraocular lens: Secondary | ICD-10-CM | POA: Diagnosis not present

## 2024-06-20 DIAGNOSIS — H401121 Primary open-angle glaucoma, left eye, mild stage: Secondary | ICD-10-CM | POA: Diagnosis not present

## 2024-07-03 DIAGNOSIS — H34811 Central retinal vein occlusion, right eye, with macular edema: Secondary | ICD-10-CM | POA: Diagnosis not present

## 2024-07-03 DIAGNOSIS — H353122 Nonexudative age-related macular degeneration, left eye, intermediate dry stage: Secondary | ICD-10-CM | POA: Diagnosis not present

## 2024-07-22 ENCOUNTER — Ambulatory Visit

## 2024-08-01 DIAGNOSIS — M353 Polymyalgia rheumatica: Secondary | ICD-10-CM | POA: Diagnosis not present

## 2024-08-01 DIAGNOSIS — R7303 Prediabetes: Secondary | ICD-10-CM | POA: Diagnosis not present

## 2024-08-01 DIAGNOSIS — Z79899 Other long term (current) drug therapy: Secondary | ICD-10-CM | POA: Diagnosis not present

## 2024-08-05 DIAGNOSIS — H401121 Primary open-angle glaucoma, left eye, mild stage: Secondary | ICD-10-CM | POA: Diagnosis not present

## 2024-08-08 DIAGNOSIS — I2510 Atherosclerotic heart disease of native coronary artery without angina pectoris: Secondary | ICD-10-CM | POA: Insufficient documentation

## 2024-08-08 DIAGNOSIS — M353 Polymyalgia rheumatica: Secondary | ICD-10-CM | POA: Diagnosis not present

## 2024-08-08 DIAGNOSIS — I700 Atherosclerosis of aorta: Secondary | ICD-10-CM | POA: Insufficient documentation

## 2024-08-08 DIAGNOSIS — Z1331 Encounter for screening for depression: Secondary | ICD-10-CM | POA: Diagnosis not present

## 2024-08-08 DIAGNOSIS — Z0000 Encounter for general adult medical examination without abnormal findings: Secondary | ICD-10-CM | POA: Diagnosis not present

## 2024-08-08 DIAGNOSIS — Z23 Encounter for immunization: Secondary | ICD-10-CM | POA: Diagnosis not present

## 2024-08-08 DIAGNOSIS — N1831 Chronic kidney disease, stage 3a: Secondary | ICD-10-CM | POA: Diagnosis not present

## 2024-08-08 DIAGNOSIS — I10 Essential (primary) hypertension: Secondary | ICD-10-CM | POA: Diagnosis not present

## 2024-08-08 NOTE — Progress Notes (Signed)
 Cardiology Office Note  Date:  08/11/2024   ID:  Jay [name], DOB Feb 18, 1941, MRN [id number]  PCP:  Diedra Lame, MD   Chief Complaint  Patient presents with   New Patient (Initial Visit)    Ref by Dr. Diedra for fatigue. Patient c/o shortness of breath, decrease in blood pressure and fatigue.     HPI:  Jay Liptak Pagoriais a 83 y.o. malewith past medical history of: Polymyalgia rheumatica Essential hypertension History of smoking Chest pain Prediabetes Coronary calcification CRI Aortic atherosclerosis of the thoracic aorta Who presents by referral from Dr. Diedra for fatigue, SOB  On discussion today he reports having significant difficulty managing PMR over the past 15 months Has been sedentary, no exercise, On chronic prednisone On initial diagnosis sed rate was markedly elevated Tracking sed rates, last was 23, CRP 21  Reports having chronic fatigue,  dizziness at times, napping a lot in the day,  blood pressure running low at given today on reduced losartan HCT 50/12.5 Was previously on 100/12.5 but this was reduced 3 days ago  No regular walking or exercise program Does something then has to take a nap Previously used to use the treadmill on a regular basis but has not done so in quite some time  Followed by eye doctor  Labs: A1C 6.2 CR 1.5  EKG personally reviewed by myself on todays visit EKG Interpretation Date/Time:  Monday August 11 2024 11:38:50 EDT Ventricular Rate:  92 PR Interval:    QRS Duration:  90 QT Interval:  346 QTC Calculation: 427 R Axis:   48  Text Interpretation: Normal sinus rhythm with sinus arrhythmia When compared with ECG of 21-Jun-2023 20:48, No significant change was found Confirmed by Perla Lye [phone]) on 08/11/2024 11:42:40 AM    PMH:   has a past medical history of Chest pain, unspecified, Hypertension, Kidney stone, Melanoma (HCC) (1983), Obesity, Other and unspecified hyperlipidemia, and Other malaise and  fatigue.  PSH:    Past Surgical History:  Procedure Laterality Date   BUNIONECTOMY     CATARACT EXTRACTION W/PHACO Left 08/15/2016   Procedure: CATARACT EXTRACTION PHACO AND INTRAOCULAR LENS PLACEMENT (IOC);  Surgeon: Elsie Carmine, MD;  Location: [facility] ORS;  Service: Ophthalmology;  Laterality: Left;  US  00:47 AP% 17.8 CDE 8.46 Fluid pack lot # [id number]   EYE SURGERY     HERNIA REPAIR     SKIN SURGERY     melanoma    Current Outpatient Medications  Medication Sig Dispense Refill   amitriptyline (ELAVIL) 25 MG tablet Take 25 mg by mouth at bedtime.     gabapentin (NEURONTIN) 800 MG tablet Take 1,200 mg daily     losartan-hydrochlorothiazide (HYZAAR) 50-12.5 MG tablet Take 1 tablet by mouth daily.     NEXLETOL 180 MG TABS Take 1 tablet by mouth daily.     predniSONE (DELTASONE) 10 MG tablet Take 15 mg by mouth daily with breakfast.     ondansetron  (ZOFRAN -ODT) 4 MG disintegrating tablet Take 1 tablet (4 mg total) by mouth every 8 (eight) hours as needed for nausea or vomiting. (Patient not taking: Reported on 08/11/2024) 20 tablet 0   No current facility-administered medications for this visit.   Allergies:   Statins   Social History:  The patient  reports that he has quit smoking. His smoking use included cigarettes. He has a 30 pack-year smoking history. He does not have any smokeless tobacco history on file. He reports current alcohol use. He reports that he does not  use drugs.   Family History:   family history includes Heart attack in his father and mother; Hypertension in his father and mother.    Review of Systems: Review of Systems  Constitutional:  Positive for malaise/fatigue.  HENT: Negative.    Respiratory: Negative.    Cardiovascular: Negative.   Gastrointestinal: Negative.   Musculoskeletal: Negative.   Neurological: Negative.   Psychiatric/Behavioral: Negative.    All other systems reviewed and are negative.   PHYSICAL EXAM: VS:  BP 100/60 (BP Location:  Right Arm, Patient Position: Sitting, Cuff Size: Normal)   Pulse 92   Ht 5' 7 (1.702 m)   Wt 210 lb 4 oz (95.4 kg)   SpO2 93%   BMI 32.93 kg/m  , BMI Body mass index is 32.93 kg/m. GEN: Well nourished, well developed, in no acute distress HEENT: normal Neck: no JVD, carotid bruits, or masses Cardiac: RRR; no murmurs, rubs, or gallops,no edema  Respiratory:  clear to auscultation bilaterally, normal work of breathing GI: soft, nontender, nondistended, + BS MS: no deformity or atrophy Skin: warm and dry, no rash Neuro:  Strength and sensation are intact Psych: euthymic mood, full affect   Recent Labs: No results found for requested labs within last 365 days.    Lipid Panel No results found for: CHOL, HDL, LDLCALC, TRIG    Wt Readings from Last 3 Encounters:  08/11/24 210 lb 4 oz (95.4 kg)  06/21/23 199 lb (90.3 kg)  03/13/22 200 lb (90.7 kg)      ASSESSMENT AND PLAN:  Problem List Items Addressed This Visit       Cardiology Problems   Coronary artery calcification - Primary   Relevant Medications   losartan-hydrochlorothiazide (HYZAAR) 50-12.5 MG tablet   Other Relevant Orders   EKG 12-Lead (Completed)   Aortic atherosclerosis (HCC)   Relevant Medications   losartan-hydrochlorothiazide (HYZAAR) 50-12.5 MG tablet   Other Relevant Orders   EKG 12-Lead (Completed)   Hypertension   Relevant Medications   losartan-hydrochlorothiazide (HYZAAR) 50-12.5 MG tablet   Other Relevant Orders   EKG 12-Lead (Completed)     Other   Chest pain   Relevant Orders   EKG 12-Lead (Completed)   Fatigue Sedentary for the past year or more Used to use the treadmill, has not been participating in regular exercise program Recommend he start using his recumbent bike on a daily basis for leg strengthening and conditioning Calorie restriction recommended Echocardiogram ordered to rule out structural heart disease  Essential hypertension Notes indicating losartan HCTZ  dosing was recently decreased down to 50/12.5 daily Blood pressure low again today confirmed by myself 100/60 He denies any orthostasis symptoms Wife has documented blood pressures, periodically less than 110 We have suggested he cut the pill in half if evening pressure runs less than 110 systolic (he has been taking the medication at nighttime) If morning pressure runs low, would increase his fluid intake  Chronic renal insufficiency Creatinine 1.5 in the setting of diabetes Recommend he stay hydrated Sugars running higher from chronic prednisone Has also gained weight over the past several years, deconditioned, sedentary  Hyperlipidemia Currently on Nexletol Could consider changing to nexlezit which would combine Zetia with bempedoic acid for an additional 20% drop in numbers  PMR Managed by primary care, on chronic prednisone  Neuropathy Previously on high-dose gabapentin, has been weaning slowly Noon dose may be contributing to some of his fatigue   Signed, Velinda Lunger, M.D., Ph.D. [facility] [place], [place] [phone]

## 2024-08-11 ENCOUNTER — Encounter: Payer: Self-pay | Admitting: Cardiovascular Disease

## 2024-08-11 ENCOUNTER — Ambulatory Visit: Attending: Cardiovascular Disease | Admitting: Cardiovascular Disease

## 2024-08-11 VITALS — BP 100/60 | HR 92 | Ht 67.0 in | Wt 210.2 lb

## 2024-08-11 DIAGNOSIS — I700 Atherosclerosis of aorta: Secondary | ICD-10-CM

## 2024-08-11 DIAGNOSIS — R079 Chest pain, unspecified: Secondary | ICD-10-CM | POA: Diagnosis not present

## 2024-08-11 DIAGNOSIS — I10 Essential (primary) hypertension: Secondary | ICD-10-CM | POA: Diagnosis not present

## 2024-08-11 DIAGNOSIS — I2510 Atherosclerotic heart disease of native coronary artery without angina pectoris: Secondary | ICD-10-CM

## 2024-08-11 DIAGNOSIS — R0600 Dyspnea, unspecified: Secondary | ICD-10-CM | POA: Diagnosis not present

## 2024-08-11 NOTE — Patient Instructions (Addendum)
 Medication Instructions:  No changes  For pressure <110 at night, Cut the losartan HCT in 1/2  For low pressure in the Am, drink fluids  If you need a refill on your cardiac medications before your next appointment, please call your pharmacy.   Lab work: No new labs needed  Testing/Procedures: Your physician has requested that you have an echocardiogram. Echocardiography is a painless test that uses sound waves to create images of your heart. It provides your doctor with information about the size and shape of your heart and how well your heart's chambers and valves are working.   You may receive an ultrasound enhancing agent through an IV if needed to better visualize your heart during the echo. This procedure takes approximately one hour.  There are no restrictions for this procedure.  This will take place at [address] [facility], [place] [address]  Please note: We ask at that you not bring children with you during ultrasound (echo/ vascular) testing. Due to room size and safety concerns, children are not allowed in the ultrasound rooms during exams. Our front office staff cannot provide observation of children in our lobby area while testing is being conducted. An adult accompanying a patient to their appointment will only be allowed in the ultrasound room at the discretion of the ultrasound technician under special circumstances. We apologize for any inconvenience.   Follow-Up: At [facility], you and your health needs are our priority.  As part of our continuing mission to provide you with exceptional heart care, we have created designated Provider Care Teams.  These Care Teams include your primary Cardiologist (physician) and Advanced Practice Providers (APPs -  Physician Assistants and Nurse Practitioners) who all work together to provide you with the care you need, when you need it.  You will need a follow up appointment as needed  Providers on your  designated Care Team:   Lonni Meager, NP Bernardino Bring, PA-C Cadence [place], [place]  COVID-19 Vaccine Information can be found at: [email or web address] For questions related to vaccine distribution or appointments, please email [email or web address] or call [phone].

## 2024-08-12 DIAGNOSIS — H34811 Central retinal vein occlusion, right eye, with macular edema: Secondary | ICD-10-CM | POA: Diagnosis not present

## 2024-08-12 DIAGNOSIS — H401121 Primary open-angle glaucoma, left eye, mild stage: Secondary | ICD-10-CM | POA: Diagnosis not present

## 2024-08-12 DIAGNOSIS — H353122 Nonexudative age-related macular degeneration, left eye, intermediate dry stage: Secondary | ICD-10-CM | POA: Diagnosis not present

## 2024-09-02 ENCOUNTER — Encounter: Payer: Self-pay | Admitting: Cardiovascular Disease

## 2024-09-12 DIAGNOSIS — H34811 Central retinal vein occlusion, right eye, with macular edema: Secondary | ICD-10-CM | POA: Diagnosis not present

## 2024-09-24 ENCOUNTER — Ambulatory Visit

## 2024-09-26 ENCOUNTER — Other Ambulatory Visit: Payer: Self-pay

## 2024-09-26 ENCOUNTER — Encounter: Payer: Self-pay | Admitting: Emergency Medicine

## 2024-09-26 ENCOUNTER — Emergency Department
Admission: EM | Admit: 2024-09-26 | Discharge: 2024-09-26 | Disposition: A | Discharge status: Home / Self Care | Attending: Emergency Medicine | Admitting: Emergency Medicine

## 2024-09-26 DIAGNOSIS — Z85820 Personal history of malignant melanoma of skin: Secondary | ICD-10-CM | POA: Diagnosis not present

## 2024-09-26 DIAGNOSIS — S61250A Open bite of right index finger without damage to nail, initial encounter: Secondary | ICD-10-CM | POA: Insufficient documentation

## 2024-09-26 DIAGNOSIS — M353 Polymyalgia rheumatica: Secondary | ICD-10-CM | POA: Diagnosis not present

## 2024-09-26 DIAGNOSIS — I10 Essential (primary) hypertension: Secondary | ICD-10-CM | POA: Insufficient documentation

## 2024-09-26 DIAGNOSIS — N1831 Chronic kidney disease, stage 3a: Secondary | ICD-10-CM | POA: Diagnosis not present

## 2024-09-26 DIAGNOSIS — W540XXA Bitten by dog, initial encounter: Secondary | ICD-10-CM | POA: Diagnosis not present

## 2024-09-26 MED ORDER — AMOXICILLIN-POT CLAVULANATE 875-125 MG PO TABS: 1.0000 | ORAL_TABLET | Freq: Two times a day (BID) | ORAL | 0 refills | Status: AC

## 2024-09-26 NOTE — ED Provider Notes (Signed)
 [facility] Provider Note    Event Date/Time   First MD Initiated Contact with Patient 09/26/24 [phone]     (approximate)   History   Animal Bite   HPI  Jay [name] is a 83 y.o. male presents to the ED after his dog bit him.  Patient states that his dog is up-to-date on rabies vaccine and was nipping at him.  He reports that his skin is very thin and opens easily.  Patient is up-to-date on tetanus immunizations.  He has history of hypertension, atherosclerosis, kidney stones, melanoma     Physical Exam   Triage Vital Signs: ED Triage Vitals  Encounter Vitals Group     BP 09/26/24 0936 103/60     Girls Systolic BP Percentile --      Girls Diastolic BP Percentile --      Boys Systolic BP Percentile --      Boys Diastolic BP Percentile --      Pulse Rate 09/26/24 0936 89     Resp 09/26/24 0936 17     Temp 09/26/24 0936 (!) 97.4 F (36.3 C)     Temp Source 09/26/24 0936 Oral     SpO2 09/26/24 0936 96 %     Weight 09/26/24 0935 200 lb (90.7 kg)     Height 09/26/24 0935 5' 7 (1.702 m)     Head Circumference --      Peak Flow --      Pain Score 09/26/24 0935 2     Pain Loc --      Pain Education --      Exclude from Growth Chart --     Most recent vital signs: Vitals:   09/26/24 0936  BP: 103/60  Pulse: 89  Resp: 17  Temp: (!) 97.4 F (36.3 C)  SpO2: 96%     General: Awake, no distress.  CV:  Good peripheral perfusion.  Resp:  Normal effort.  Abd:  No distention.  Other:  Right index finger dorsal aspect has a 2 cm laceration superficial near the PIP joint.  No foreign bodies noted and no active bleeding at this time.  Patient is able to flex and extend digit without any difficulty.  Capillary refills less than 3 seconds.  Patient also has a V-shaped superficial 1 cm laceration to the distal pad on the right index finger volar aspect.  There is also is not actively bleeding.   ED Results / Procedures / Treatments   Labs (all  labs ordered are listed, but only abnormal results are displayed) Labs Reviewed - No data to display    PROCEDURES:  Critical Care performed:   Procedures   MEDICATIONS ORDERED IN ED: Medications - No data to display   IMPRESSION / MDM / ASSESSMENT AND PLAN / ED COURSE  I reviewed the triage vital signs and the nursing notes.   Differential diagnosis includes, but is not limited to, dog bite, tendon injury, foreign body, bleeding controlled, tetanus immunization.  83 year old male presents to the ED after being bitten by his dog that is up-to-date on rabies vaccine.  Patient finger was cleaned and Steri-Strips were placed.  Patient was made aware that dog bites tend to get infected very easily.  He is to watch these areas very closely and if any signs of infection or drainage he is to remove the Steri-Strips.  A prescription for Augmentin was sent to the pharmacy for him to begin taking.  Patient was up-to-date on  tetanus immunization.  He will follow-up with his PCP, urgent care or return to the emergency department if any concerns of infection or complications.      Patient's presentation is most consistent with acute complicated illness / injury requiring diagnostic workup.  FINAL CLINICAL IMPRESSION(S) / ED DIAGNOSES   Final diagnoses:  Dog bite of index finger, initial encounter     Rx / DC Orders   ED Discharge Orders          Ordered    amoxicillin-clavulanate (AUGMENTIN) 875-125 MG tablet  2 times daily        09/26/24 1046             Note:  This document was prepared using Dragon voice recognition software and may include unintentional dictation errors.   Saunders Shona CROME, PA-C 09/26/24 1541    Dorothyann Drivers, MD 09/30/24 1434

## 2024-09-26 NOTE — ED Notes (Signed)
 See triage note  Presents s/p dog bite  States it was his dog   Bite noticed to right index finger  Bleeding controlled

## 2024-09-26 NOTE — ED Triage Notes (Signed)
 Patient to ED via POV for dog bite to right pointer finger. Pt states it was his dog and he came up behind the dog and scared him nipping at his finger. Dogs vaccinations up to date. Bleeding controlled at this time.

## 2024-09-26 NOTE — Discharge Instructions (Addendum)
 Keep the area clean and dry.  Watch for any signs of infection.  If the finger begins looking red or if there is drainage you may need to remove the Steri-Strips to allow drainage but also return to the emergency department for further evaluation.  Elevate to reduce swelling.  A prescription for antibiotics was sent to the pharmacy for you to begin taking for infection prevention

## 2024-10-03 DIAGNOSIS — M353 Polymyalgia rheumatica: Secondary | ICD-10-CM | POA: Diagnosis not present

## 2024-10-03 DIAGNOSIS — N1831 Chronic kidney disease, stage 3a: Secondary | ICD-10-CM | POA: Diagnosis not present

## 2024-10-03 DIAGNOSIS — I10 Essential (primary) hypertension: Secondary | ICD-10-CM | POA: Diagnosis not present

## 2024-10-03 DIAGNOSIS — Z79899 Other long term (current) drug therapy: Secondary | ICD-10-CM | POA: Diagnosis not present

## 2024-10-10 DIAGNOSIS — H34811 Central retinal vein occlusion, right eye, with macular edema: Secondary | ICD-10-CM | POA: Diagnosis not present

## 2024-11-17 ENCOUNTER — Ambulatory Visit: Attending: Cardiovascular Disease

## 2024-11-17 DIAGNOSIS — R0600 Dyspnea, unspecified: Secondary | ICD-10-CM | POA: Diagnosis not present

## 2024-11-17 MED ORDER — PERFLUTREN LIPID MICROSPHERE
1.0000 mL | INTRAVENOUS | Status: AC | PRN
  Administered 2024-11-17: 1 mL via INTRAVENOUS

## 2024-11-18 LAB — ECHOCARDIOGRAM COMPLETE
AR max vel: 2.21 cm2
AV Area VTI: 2.08 cm2
AV Area mean vel: 1.94 cm2
AV Mean grad: 3.4 mmHg
AV Peak grad: 6.2 mmHg
Ao pk vel: 1.25 m/s
Area-P 1/2: 3.37 cm2
S' Lateral: 3.8 cm

## 2024-11-23 ENCOUNTER — Ambulatory Visit: Payer: Self-pay | Admitting: Cardiovascular Disease

## 2024-11-23 DIAGNOSIS — R072 Precordial pain: Secondary | ICD-10-CM

## 2024-11-26 ENCOUNTER — Encounter
Admission: RE | Admit: 2024-11-26 | Discharge: 2024-11-26 | Disposition: A | Discharge status: Home / Self Care | Source: Ambulatory Visit | Attending: Cardiovascular Disease | Admitting: Cardiovascular Disease

## 2024-11-26 ENCOUNTER — Other Ambulatory Visit: Payer: Self-pay | Admitting: Physician Assistant

## 2024-11-26 DIAGNOSIS — R072 Precordial pain: Secondary | ICD-10-CM | POA: Diagnosis not present

## 2024-11-26 LAB — NM MYOCAR MULTI W/SPECT W/WALL MOTION / EF
LV dias vol: 117 mL (ref 62–150)
LV sys vol: 60 mL
MPHR: 136 {beats}/min
Nuc Stress EF: 49 %
Peak HR: 83 {beats}/min
Percent HR: 61 %
Rest HR: 80 {beats}/min
Rest Nuclear Isotope Dose: 10.6 mCi
SDS: 2
SRS: 0
SSS: 11
ST Depression (mm): 0 mm
Stress Nuclear Isotope Dose: 32.6 mCi
TID: 1.16

## 2024-11-26 MED ORDER — REGADENOSON 0.4 MG/5ML IV SOLN
0.4000 mg | Freq: Once | INTRAVENOUS | Status: AC
  Administered 2024-11-26: 0.4 mg via INTRAVENOUS

## 2024-11-26 MED ORDER — TECHNETIUM TC 99M TETROFOSMIN IV KIT
32.5600 | PACK | Freq: Once | INTRAVENOUS | Status: AC | PRN
  Administered 2024-11-26: 32.56 via INTRAVENOUS

## 2024-11-26 MED ORDER — TECHNETIUM TC 99M TETROFOSMIN IV KIT
10.6100 | PACK | Freq: Once | INTRAVENOUS | Status: AC | PRN
  Administered 2024-11-26: 10.61 via INTRAVENOUS

## 2024-11-26 NOTE — Progress Notes (Signed)
" °  ° °  Jay [name] presented for a nuclear stress test today.  I Lesley LITTIE Maffucci, PA-C, provided direct supervision and was present during the stress portion of the study today, which was completed without significant symptoms, immediate complications, or acute ST/T changes on ECG.  Stress imaging is pending at this time.  Preliminary ECG findings may be listed in the chart, but the stress test result will not be finalized until perfusion imaging is complete.  Lesley LITTIE Maffucci, PA-C  11/26/2024, 10:54 AM    "

## 2024-12-01 ENCOUNTER — Ambulatory Visit: Payer: Self-pay | Admitting: Cardiovascular Disease

## 2024-12-01 ENCOUNTER — Other Ambulatory Visit: Payer: Self-pay | Admitting: Emergency Medicine

## 2024-12-01 MED ORDER — EZETIMIBE 10 MG PO TABS: 10.0000 mg | ORAL_TABLET | Freq: Every day | ORAL | 3 refills | Status: AC
